# Patient Record
Sex: Female | Born: 1985 | Hispanic: Yes | Marital: Single | Smoking: Former smoker
Health system: Southern US, Community
[De-identification: ages and names within clinical notes are randomized; demographics above are authoritative.]

## PROBLEM LIST (undated history)

## (undated) ENCOUNTER — Emergency Department (HOSPITAL_COMMUNITY): Payer: Self-pay | Source: Home / Self Care

## (undated) DIAGNOSIS — F32A Depression, unspecified: Secondary | ICD-10-CM

## (undated) DIAGNOSIS — E785 Hyperlipidemia, unspecified: Secondary | ICD-10-CM

## (undated) DIAGNOSIS — I639 Cerebral infarction, unspecified: Secondary | ICD-10-CM

## (undated) DIAGNOSIS — O034 Incomplete spontaneous abortion without complication: Secondary | ICD-10-CM

## (undated) DIAGNOSIS — F329 Major depressive disorder, single episode, unspecified: Secondary | ICD-10-CM

## (undated) HISTORY — PX: DILATION AND CURETTAGE OF UTERUS: SHX78

## (undated) HISTORY — DX: Incomplete spontaneous abortion without complication: O03.4

## (undated) HISTORY — DX: Hyperlipidemia, unspecified: E78.5

---
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
## 2006-06-02 ENCOUNTER — Emergency Department: Payer: Self-pay | Admitting: Emergency Medicine

## 2006-06-19 ENCOUNTER — Emergency Department: Payer: Self-pay | Admitting: Emergency Medicine

## 2006-09-28 ENCOUNTER — Emergency Department: Payer: Self-pay | Admitting: Emergency Medicine

## 2007-02-19 ENCOUNTER — Emergency Department: Payer: Self-pay | Admitting: Emergency Medicine

## 2007-02-22 ENCOUNTER — Ambulatory Visit: Payer: Self-pay | Admitting: Obstetrics and Gynecology

## 2007-02-24 ENCOUNTER — Ambulatory Visit: Payer: Self-pay | Admitting: Obstetrics and Gynecology

## 2008-05-13 ENCOUNTER — Emergency Department: Payer: Self-pay | Admitting: Emergency Medicine

## 2010-07-04 ENCOUNTER — Emergency Department: Payer: Self-pay | Admitting: Unknown Physician Specialty

## 2012-10-01 ENCOUNTER — Emergency Department: Payer: Self-pay | Admitting: Emergency Medicine

## 2012-10-01 LAB — BASIC METABOLIC PANEL
Calcium, Total: 8.6 mg/dL (ref 8.5–10.1)
EGFR (Non-African Amer.): >60
Glucose: 105 mg/dL — ABNORMAL HIGH (ref 65–99)
Osmolality: 284 (ref 275–301)
Potassium: 3.7 mmol/L (ref 3.5–5.1)
Sodium: 141 mmol/L (ref 136–145)

## 2012-10-01 LAB — CBC
HCT: 39.6 % (ref 35.0–47.0)
HGB: 13.5 g/dL (ref 12.0–16.0)
MCH: 31.6 pg (ref 26.0–34.0)
MCHC: 34.1 g/dL (ref 32.0–36.0)
RBC: 4.27 10*6/uL (ref 3.80–5.20)
RDW: 12.7 % (ref 11.5–14.5)
WBC: 9.9 10*3/uL (ref 3.6–11.0)

## 2012-10-02 LAB — MONONUCLEOSIS SCREEN: Mono Test: NEGATIVE

## 2012-10-04 LAB — BETA STREP CULTURE(ARMC)

## 2013-05-27 ENCOUNTER — Emergency Department: Payer: Self-pay | Admitting: Emergency Medicine

## 2013-05-27 LAB — CBC
HCT: 35.9 % (ref 35.0–47.0)
HGB: 12.4 g/dL (ref 12.0–16.0)
MCH: 31.1 pg (ref 26.0–34.0)
Platelet: 235 10*3/uL (ref 150–440)
RDW: 12.3 % (ref 11.5–14.5)

## 2013-05-27 LAB — URINALYSIS, COMPLETE
Bilirubin,UR: NEGATIVE
Blood: NEGATIVE
Glucose,UR: NEGATIVE mg/dL (ref 0–75)
Ketone: NEGATIVE
Leukocyte Esterase: NEGATIVE
Nitrite: NEGATIVE
Ph: 5 (ref 4.5–8.0)
RBC,UR: 1 /HPF (ref 0–5)
Specific Gravity: 1.028 (ref 1.003–1.030)
WBC UR: 6 /HPF (ref 0–5)

## 2013-05-27 LAB — COMPREHENSIVE METABOLIC PANEL
Alkaline Phosphatase: 58 U/L
Anion Gap: 6 — ABNORMAL LOW (ref 7–16)
Bilirubin,Total: 0.2 mg/dL (ref 0.2–1.0)
Calcium, Total: 8.3 mg/dL — ABNORMAL LOW (ref 8.5–10.1)
Chloride: 111 mmol/L — ABNORMAL HIGH (ref 98–107)
Osmolality: 275 (ref 275–301)
Potassium: 3.5 mmol/L (ref 3.5–5.1)
SGPT (ALT): 17 U/L (ref 12–78)

## 2013-05-27 LAB — PREGNANCY, URINE: Pregnancy Test, Urine: POSITIVE m[IU]/mL

## 2013-05-27 LAB — MAGNESIUM: Magnesium: 1.3 mg/dL — ABNORMAL LOW

## 2013-05-28 LAB — GC/CHLAMYDIA PROBE AMP

## 2013-06-06 DIAGNOSIS — O0990 Supervision of high risk pregnancy, unspecified, unspecified trimester: Secondary | ICD-10-CM | POA: Insufficient documentation

## 2013-06-06 DIAGNOSIS — R7303 Prediabetes: Secondary | ICD-10-CM | POA: Insufficient documentation

## 2013-06-06 DIAGNOSIS — IMO0002 Reserved for concepts with insufficient information to code with codable children: Secondary | ICD-10-CM | POA: Insufficient documentation

## 2013-06-06 DIAGNOSIS — Z8759 Personal history of other complications of pregnancy, childbirth and the puerperium: Secondary | ICD-10-CM | POA: Insufficient documentation

## 2013-06-25 DIAGNOSIS — F319 Bipolar disorder, unspecified: Secondary | ICD-10-CM | POA: Insufficient documentation

## 2013-09-27 ENCOUNTER — Emergency Department: Payer: Self-pay | Admitting: Emergency Medicine

## 2013-10-04 DIAGNOSIS — M778 Other enthesopathies, not elsewhere classified: Secondary | ICD-10-CM | POA: Insufficient documentation

## 2013-11-13 ENCOUNTER — Observation Stay: Payer: Self-pay | Admitting: Obstetrics and Gynecology

## 2013-11-13 LAB — CREATININE, SERUM
CREATININE: 0.6 mg/dL (ref 0.60–1.30)
EGFR (African American): >60
EGFR (Non-African Amer.): >60

## 2013-11-13 LAB — PROTEIN / CREATININE RATIO, URINE
Creatinine, Urine: 60.2 mg/dL (ref 30.0–125.0)
PROTEIN, RANDOM URINE: 85 mg/dL — AB (ref 0–12)
Protein/Creat. Ratio: 1412 mg/gCREAT — ABNORMAL HIGH (ref 0–200)

## 2013-11-13 LAB — SGOT (AST)(ARMC): SGOT(AST): 17 U/L (ref 15–37)

## 2013-11-13 LAB — HEMOGLOBIN: HGB: 10.1 g/dL — ABNORMAL LOW (ref 12.0–16.0)

## 2013-11-13 LAB — PLATELET COUNT: Platelet: 191 10*3/uL (ref 150–440)

## 2013-11-13 LAB — ALT: SGPT (ALT): 11 U/L — ABNORMAL LOW (ref 12–78)

## 2013-11-13 LAB — HEMATOCRIT: HCT: 30.8 % — ABNORMAL LOW (ref 35.0–47.0)

## 2013-12-03 DIAGNOSIS — B001 Herpesviral vesicular dermatitis: Secondary | ICD-10-CM | POA: Insufficient documentation

## 2014-05-22 ENCOUNTER — Emergency Department: Payer: Self-pay | Admitting: Emergency Medicine

## 2014-05-22 LAB — BASIC METABOLIC PANEL
Anion Gap: 9 (ref 7–16)
BUN: 10 mg/dL (ref 7–18)
CO2: 29 mmol/L (ref 21–32)
Calcium, Total: 10.6 mg/dL — ABNORMAL HIGH (ref 8.5–10.1)
Chloride: 101 mmol/L (ref 98–107)
Creatinine: 0.55 mg/dL — ABNORMAL LOW (ref 0.60–1.30)
EGFR (African American): >60
Glucose: 110 mg/dL — ABNORMAL HIGH (ref 65–99)
OSMOLALITY: 277 (ref 275–301)
Potassium: 3.8 mmol/L (ref 3.5–5.1)
Sodium: 139 mmol/L (ref 136–145)

## 2014-05-22 LAB — CBC
HCT: 42.7 % (ref 35.0–47.0)
HGB: 14.4 g/dL (ref 12.0–16.0)
MCH: 30.9 pg (ref 26.0–34.0)
MCHC: 33.7 g/dL (ref 32.0–36.0)
MCV: 92 fL (ref 80–100)
PLATELETS: 224 10*3/uL (ref 150–440)
RBC: 4.65 10*6/uL (ref 3.80–5.20)
RDW: 13 % (ref 11.5–14.5)
WBC: 10.9 10*3/uL (ref 3.6–11.0)

## 2014-05-22 LAB — TROPONIN I

## 2014-10-29 NOTE — H&P (Signed)
L&D Evaluation:  History Expanded:  HPI 29 yo G3P2002 at 8455w4d gestational age by unknown criteria (no records available).  The patient states that [facility].  [facility] has no records of her pregnancy at this time.  She states that she is high [facility] due to a history of high cholesterol, preeclampsia with her pregnancies, and history of 2 prior cesarean sections. She otherwise has had no other complications with this pregnancy. She presents with decreased fetal movement, no leakage of fluid, no vaginal bleeding. no contractions.   She denies headaches, visual disturbances, and RUQ pain.   Group B Strep Results Maternal (Result >5wks must be treated as unknown) unknown/result > 5 weeks ago   Patient's Medical History 1) high cholesterol, 2) obesity   Patient's Surgical History 1) prior c-sections x 2, 2) D&C   Medications Pre Natal Vitamins  omeprazole   Allergies NKDA   Social History none   ROS:  ROS All systems were reviewed.  HEENT, CNS, GI, GU, Respiratory, CV, Renal and Musculoskeletal systems were found to be normal., unless otherwise noted in HPI   Exam:  Vital Signs T98.5, BP 128/72, 140/77, 109/54, 102/54, 105/57, 102/56, 136/94, 138/80, P 80s-90s,   General no apparent distress   Mental Status clear   Chest clear   Heart normal sinus rhythm   Abdomen gravid, non-tender   Back no CVAT   Edema no edema   Reflexes 2+   FHT normal rate with no decels   FHT Description 135/mod var/+accels/no decels   Ucx absent   Impression:  Impression reactive NST, 1) Intrauterine pregnancy at 5855w4d   Plan:  Comments 1) decreased fetal movement: reassured patient given gestational age with reactive NST. 2) elevated BPs: will get preeclampsia panel.  UPC ratio ~1,400. Highly recommend to patient that she get close follow up at this point to monitor BPs.  Will need at least weekly NSTs.   3) encouraged follow up ASAP given BPs and elevated UPC  ratio and history of preeclampsia.   Follow Up Appointment need to schedule. ASAP   Labs:  Lab Results:  Misc Urine Chem:  26-May-15 21:12   Protein/Creat Ratio (comp)  1412 (Result(s) reported on 13 Nov 2013 at 10:09PM.)   Electronic Signatures: Conard NovakJackson, Brynn Mulgrew D (MD)  (Signed 26-May-15 22:36)  Authored: L&D Evaluation, Labs   Last Updated: 26-May-15 22:36 by Conard NovakJackson, Saniyya Gau D (MD)

## 2015-04-23 ENCOUNTER — Emergency Department
Admission: EM | Admit: 2015-04-23 | Discharge: 2015-04-23 | Disposition: A | Discharge status: Home / Self Care | Payer: Self-pay | Attending: Emergency Medicine | Admitting: Emergency Medicine

## 2015-04-23 ENCOUNTER — Emergency Department: Payer: Self-pay

## 2015-04-23 ENCOUNTER — Encounter: Payer: Self-pay | Admitting: Emergency Medicine

## 2015-04-23 DIAGNOSIS — Z87891 Personal history of nicotine dependence: Secondary | ICD-10-CM | POA: Insufficient documentation

## 2015-04-23 DIAGNOSIS — Z3A01 Less than 8 weeks gestation of pregnancy: Secondary | ICD-10-CM | POA: Insufficient documentation

## 2015-04-23 DIAGNOSIS — O034 Incomplete spontaneous abortion without complication: Secondary | ICD-10-CM | POA: Insufficient documentation

## 2015-04-23 DIAGNOSIS — I10 Essential (primary) hypertension: Secondary | ICD-10-CM | POA: Insufficient documentation

## 2015-04-23 DIAGNOSIS — R109 Unspecified abdominal pain: Secondary | ICD-10-CM

## 2015-04-23 LAB — URINALYSIS COMPLETE WITH MICROSCOPIC (ARMC ONLY)
BILIRUBIN URINE: NEGATIVE
Bacteria, UA: NONE SEEN
Glucose, UA: NEGATIVE mg/dL
KETONES UR: NEGATIVE mg/dL
Nitrite: NEGATIVE
PH: 6 (ref 5.0–8.0)
Protein, ur: NEGATIVE mg/dL
Specific Gravity, Urine: 1.018 (ref 1.005–1.030)

## 2015-04-23 LAB — CBC
HCT: 35.4 % (ref 35.0–47.0)
Hemoglobin: 12 g/dL (ref 12.0–16.0)
MCH: 29.2 pg (ref 26.0–34.0)
MCHC: 33.8 g/dL (ref 32.0–36.0)
MCV: 86.4 fL (ref 80.0–100.0)
Platelets: 211 10*3/uL (ref 150–440)
RBC: 4.1 MIL/uL (ref 3.80–5.20)
RDW: 15.4 % — ABNORMAL HIGH (ref 11.5–14.5)
WBC: 10.1 10*3/uL (ref 3.6–11.0)

## 2015-04-23 LAB — ABO/RH: ABO/RH(D): O POS

## 2015-04-23 LAB — HCG, QUANTITATIVE, PREGNANCY: hCG, Beta Chain, Quant, S: 5120 m[IU]/mL — ABNORMAL HIGH (ref <5)

## 2015-04-23 MED ORDER — OXYCODONE-ACETAMINOPHEN 5-325 MG PO TABS
ORAL_TABLET | ORAL | Status: AC
  Administered 2015-04-23: 1 via ORAL
  Filled 2015-04-23: qty 1

## 2015-04-23 MED ORDER — OXYCODONE-ACETAMINOPHEN 5-325 MG PO TABS
1.0000 | ORAL_TABLET | Freq: Once | ORAL | Status: AC
  Administered 2015-04-23: 1 via ORAL

## 2015-04-23 MED ORDER — OXYCODONE-ACETAMINOPHEN 5-325 MG PO TABS: 1.0000 | ORAL_TABLET | Freq: Four times a day (QID) | ORAL | Status: DC | PRN

## 2015-04-23 MED ORDER — OXYCODONE-ACETAMINOPHEN 5-325 MG PO TABS
1.0000 | ORAL_TABLET | Freq: Once | ORAL | Status: AC
  Administered 2015-04-23: 1 via ORAL
  Filled 2015-04-23: qty 1

## 2015-04-23 NOTE — Discharge Instructions (Signed)
Incomplete Miscarriage A miscarriage is the sudden loss of an unborn baby (fetus) before the 20th week of pregnancy. In an incomplete miscarriage, parts of the fetus or placenta (afterbirth) remain in the body.  Having a miscarriage can be an emotional experience. Talk with your health care provider about any questions you may have about miscarrying, the grieving process, and your future pregnancy plans. CAUSES   Problems with the fetal chromosomes that make it impossible for the baby to develop normally. Problems with the baby's genes or chromosomes are most often the result of errors that occur by chance as the embryo divides and grows. The problems are not inherited from the parents.  Infection of the cervix or uterus.  Hormone problems.  Problems with the cervix, such as having an incompetent cervix. This is when the tissue in the cervix is not strong enough to hold the pregnancy.  Problems with the uterus, such as an abnormally shaped uterus, uterine fibroids, or congenital abnormalities.  Certain medical conditions.  Smoking, drinking alcohol, or taking illegal drugs.  Trauma. SYMPTOMS   Vaginal bleeding or spotting, with or without cramps or pain.  Pain or cramping in the abdomen or lower back.  Passing fluid, tissue, or blood clots from the vagina. DIAGNOSIS  Your health care provider will perform a physical exam. You may also have an ultrasound to confirm the miscarriage. Blood or urine tests may also be ordered. TREATMENT   Usually, a dilation and curettage (D&C) procedure is performed. During a D&C procedure, the cervix is widened (dilated) and any remaining fetal or placental tissue is gently removed from the uterus.  Antibiotic medicines are prescribed if there is an infection. Other medicines may be given to reduce the size of the uterus (contract) if there is a lot of bleeding.  If you have Rh negative blood and your baby was Rh positive, you will need a Rho (D)  immune globulin shot. This shot will protect any future baby from having Rh blood problems in future pregnancies.  You may be confined to bed rest. This means you should stay in bed and only get up to use the bathroom. HOME CARE INSTRUCTIONS   Rest as directed by your health care provider.  Restrict activity as directed by your health care provider. You may be allowed to continue light activity if curettage was not done but you require further treatment.  Keep track of the number of pads you use each day. Keep track of how soaked (saturated) they are. Record this information.  Do not  use tampons.  Do not douche or have sexual intercourse until approved by your health care provider.  Keep all follow-up appointments for reevaluation and continuing management.  Only take over-the-counter or prescription medicines for pain, fever, or discomfort as directed by your health care provider.  Take antibiotic medicine as directed by your health care provider. Make sure you finish it even if you start to feel better. SEEK IMMEDIATE MEDICAL CARE IF:   You experience severe cramps in your stomach, back, or abdomen.  You have an unexplained temperature (make sure to record these temperatures).  You pass large clots or tissue (save these for your health care provider to inspect).  Your bleeding increases.  You become light-headed, weak, or have fainting episodes. MAKE SURE YOU:   Understand these instructions.  Will watch your condition.  Will get help right away if you are not doing well or get worse.   This information is not intended to   replace advice given to you by your health care provider. Make sure you discuss any questions you have with your health care provider.   Document Released: 06/07/2005 Document Revised: 06/28/2014 Document Reviewed: 01/04/2013 Elsevier Interactive Patient Education 2016 [employer] Inc[employer]  

## 2015-04-23 NOTE — ED Notes (Signed)
Patient still in US.

## 2015-04-23 NOTE — ED Provider Notes (Addendum)
[facility] Emergency Department Provider Note  REMINDER - THIS NOTE IS NOT A FINAL MEDICAL RECORD UNTIL IT IS SIGNED. UNTIL THEN, THE CONTENT BELOW MAY REFLECT INFORMATION FROM A DOCUMENTATION TEMPLATE, NOT THE ACTUAL PATIENT VISIT. ____________________________________________  Time seen: Approximately 11:19 PM  I have reviewed the triage vital signs and the nursing notes.   HISTORY  Chief Complaint Abdominal Cramping    HPI Alice Austin is a 29 y.o. female history of hypertension, hypercholesterolemia, and 3 previous C-sections and a D&C.  Patient reports that she's been pregnant, last menstrual period was about middle of August. She [facility]. On Tuesday she started to develop some abdominal cramps and bleeding which is consistent with a period, the bleeding has since stopped but she continues to have occasional and moderately painful cramps in the lower abdomen. She called the clinic today, they advised her to come back for evaluation but [facility] so therefore came to our emergency room.  She denies any nausea, vomiting, fever, lightheadedness. She did take 1 Percocet pill at approximately 6 PM which she had left over from previous cesarean section, but has now run out.  History reviewed. No pertinent past medical history.  There are no active problems to display for this patient.   Past Surgical History  Procedure Laterality Date  . Cesarean section    . Dilation and curettage of uterus      No current outpatient prescriptions on file.  Allergies Review of patient's allergies indicates no known allergies.  No family history on file.  Social History Social History  Substance Use Topics  . Smoking status: Former Games developermoker  . Smokeless tobacco: None  . Alcohol Use: No    Review of Systems Constitutional: No fever/chills Eyes: No visual changes. ENT: No  sore throat. Cardiovascular: Denies chest pain. Respiratory: Denies shortness of breath. Gastrointestinal: N No nausea, no vomiting.  No diarrhea.  No constipation. Genitourinary: Negative for dysuria. Musculoskeletal: Negative for back pain. Skin: Negative for rash. Neurological: Negative for headaches, focal weakness or numbness.  10-point ROS otherwise negative.  ____________________________________________   PHYSICAL EXAM:  VITAL SIGNS: ED Triage Vitals  Enc Vitals Group     BP 04/23/15 2007 135/95 mmHg     Pulse Rate 04/23/15 2007 93     Resp 04/23/15 2007 18     Temp 04/23/15 2007 98.2 F (36.8 C)     Temp Source 04/23/15 2007 Oral     SpO2 04/23/15 2007 97 %     Weight 04/23/15 2007 211 lb (95.709 kg)     Height 04/23/15 2007 5' (1.524 m)     Head Cir --      Peak Flow --      Pain Score 04/23/15 2006 9     Pain Loc --      Pain Edu? --      Excl. in GC? --    Constitutional: Alert and oriented. Well appearing and in no acute distress. Eyes: Conjunctivae are normal. PERRL. EOMI. Head: Atraumatic. Nose: No congestion/rhinnorhea. Mouth/Throat: Mucous membranes are moist.  Oropharynx non-erythematous. Neck: No stridor.   Cardiovascular: Normal rate, regular rhythm. Grossly normal heart sounds.  Good peripheral circulation. Respiratory: Normal respiratory effort.  No retractions. Lungs CTAB. Gastrointestinal: Soft and nontender to for some mild to moderate tenderness suprapubically without rebound or guarding. No distention. No abdominal bruits. No CVA tenderness. Musculoskeletal: No lower extremity  tenderness nor edema.  No joint effusions. Neurologic:  Normal speech and language. No gross focal neurologic deficits are appreciated. No gait instability. Skin:  Skin is warm, dry and intact. No rash noted. Psychiatric: Mood and affect are normal. Speech and behavior are normal.  ____________________________________________   LABS (all labs ordered are listed, but  only abnormal results are displayed)  Labs Reviewed  CBC - Abnormal; Notable for the following:    RDW 15.4 (*)    All other components within normal limits  HCG, QUANTITATIVE, PREGNANCY - Abnormal; Notable for the following:    hCG, Beta Chain, Quant, S 5120 (*)    All other components within normal limits  URINALYSIS COMPLETEWITH MICROSCOPIC ([facility] ONLY) - Abnormal; Notable for the following:    Color, Urine YELLOW (*)    APPearance CLOUDY (*)    Hgb urine dipstick 3+ (*)    Leukocytes, UA TRACE (*)    Squamous Epithelial / LPF 6-30 (*)    All other components within normal limits  ABO/RH   ____________________________________________  EKG   ____________________________________________  RADIOLOGY  US OB Comp Less 14 Wks (Final result) Result time: 04/23/15 22:33:39   Final result by Rad Results In Interface (04/23/15 22:33:39)   Narrative:   CLINICAL DATA: Lower abdominal pain of 8 hours duration. Vaginal bleeding.  EXAM: OBSTETRIC <14 WK [place] AND TRANSVAGINAL OB US  TECHNIQUE: Both transabdominal and transvaginal ultrasound examinations were performed for complete evaluation of the gestation as well as the maternal uterus, adnexal regions, and pelvic cul-de-sac. Transvaginal technique was performed to assess early pregnancy.  COMPARISON: None.  FINDINGS: Intrauterine gestational sac: There is a single gestational sac, located in the upper endocervical canal.  Yolk sac: No  Embryo: Yes  Cardiac Activity: None  Heart Rate: 0 bpm  MSD:  mm  w   d  CRL: 4.4 mm  6 w  1 d         [place] EDC:  Maternal uterus/adnexae: Unremarkable ovaries. 4.5 cm anterior uterine fibroid.  IMPRESSION: Incomplete abortion. Gestational sac with nonviable fetal pole is visible in the upper endocervical canal. Incidentally noted 4.5 cm anterior uterine fibroid.    ____________________________________________   PROCEDURES  Procedure(s) performed:  None  Critical Care performed: No  ____________________________________________   INITIAL IMPRESSION / ASSESSMENT AND PLAN / ED COURSE  Pertinent labs & imaging results that were available during my care of the patient were reviewed by me and considered in my medical decision making (see chart for details).  She presents with abdominal pain after chemical abortion. She is in no distress, hemodynamically stable. Ultrasound demonstrates incomplete abortion. Called and discussed with on-call gynecologist.  Patient shows no evidence of infection, hemodynamic compromise, or significant hemorrhage. Her Rh status is positive. ____________________________________________   FINAL CLINICAL IMPRESSION(S) / ED DIAGNOSES  Final diagnoses:  Abdominal cramping        Discussed with Dr. Ardine Bjork: Advises call tomorrow for follow-up in next 48 hours as outpatient. Discussed with the patient, she is hemodynamically stable, she is in no distress, and is agreeable with the plan to follow-up with obstetrics and gynecology within the next 1-2 days at the clinic.  I discussed with the patient the importance of follow-up, she is very agreeable, we also discussed return precautions including any severe pain, fever, heavy bleeding, lightheadedness, vomiting or other new concerns arise.  Patient's mother's driving her home.  Sharyn Creamer, MD 04/23/15 2323  I will prescribe the patient a narcotic pain medicine due  to their condition which I anticipate will cause at least moderate pain short term. I discussed with the patient safe use of narcotic pain medicines, and that they are not to drive, work in dangerous areas, or ever take more than prescribed (no more than 1 pill every 6 hours). We discussed that this is the type of medication that "Criss Alvine" may have overdosed on and the risks of this type of medicine. Patient is very agreeable to only use as prescribed and to never use more than  prescribed.   Sharyn Creamer, MD 04/23/15 2329

## 2015-04-23 NOTE — ED Notes (Signed)
Patient actually in ultrasound.  Has not arrived to room yet.

## 2015-04-23 NOTE — ED Notes (Signed)
Patient ambulatory to triage, appears uncomfortable with slow gait and grimacing; pt reports (approx 4-[redacted]wks pregnant,  G5 P3)abortion pill 10/31 1230p in office; took additional at 6pm 11/1; had subsequent bleeding; st now >8hrs and having severe lower abd pain radiating thru to back with no bleeding; took percocet from old rx for csection (last ds 4pm 1 tab)

## 2015-04-24 ENCOUNTER — Encounter: Payer: Self-pay | Admitting: Obstetrics and Gynecology

## 2015-04-24 ENCOUNTER — Ambulatory Visit (INDEPENDENT_AMBULATORY_CARE_PROVIDER_SITE_OTHER): Payer: Self-pay | Admitting: Obstetrics and Gynecology

## 2015-04-24 VITALS — BP 132/85 | HR 86 | Ht 60.0 in | Wt 205.4 lb

## 2015-04-24 DIAGNOSIS — O034 Incomplete spontaneous abortion without complication: Secondary | ICD-10-CM

## 2015-04-24 MED ORDER — OXYCODONE-ACETAMINOPHEN 5-325 MG PO TABS: 1.0000 | ORAL_TABLET | Freq: Four times a day (QID) | ORAL | Status: DC | PRN

## 2015-04-24 NOTE — Patient Instructions (Signed)
1.  Surgery is scheduled for tomorrow morning. 2.  Do not eat anything after midnight or drink anything after midnight. 3.  Report to pre-admit testing at 7 AM  In the hospital lobby. 4.  Return in 1 week for a postop check following your D&C

## 2015-04-24 NOTE — ED Notes (Signed)

## 2015-04-25 ENCOUNTER — Encounter
Admission: RE | Disposition: A | Discharge status: Home / Self Care | Payer: Self-pay | Source: Ambulatory Visit | Attending: Obstetrics and Gynecology

## 2015-04-25 ENCOUNTER — Ambulatory Visit
Admission: RE | Admit: 2015-04-25 | Discharge: 2015-04-25 | Disposition: A | Discharge status: Home / Self Care | Payer: Self-pay | Source: Ambulatory Visit | Attending: Obstetrics and Gynecology | Admitting: Obstetrics and Gynecology

## 2015-04-25 ENCOUNTER — Encounter: Payer: Self-pay | Admitting: *Deleted

## 2015-04-25 ENCOUNTER — Ambulatory Visit: Payer: Self-pay | Admitting: Anesthesiology

## 2015-04-25 DIAGNOSIS — Z3A01 Less than 8 weeks gestation of pregnancy: Secondary | ICD-10-CM | POA: Insufficient documentation

## 2015-04-25 DIAGNOSIS — Z833 Family history of diabetes mellitus: Secondary | ICD-10-CM | POA: Insufficient documentation

## 2015-04-25 DIAGNOSIS — O034 Incomplete spontaneous abortion without complication: Secondary | ICD-10-CM | POA: Insufficient documentation

## 2015-04-25 DIAGNOSIS — Z9889 Other specified postprocedural states: Secondary | ICD-10-CM | POA: Insufficient documentation

## 2015-04-25 DIAGNOSIS — Z87891 Personal history of nicotine dependence: Secondary | ICD-10-CM | POA: Insufficient documentation

## 2015-04-25 DIAGNOSIS — E785 Hyperlipidemia, unspecified: Secondary | ICD-10-CM | POA: Insufficient documentation

## 2015-04-25 HISTORY — PX: DILATION AND EVACUATION: SHX1459

## 2015-04-25 LAB — CBC WITH DIFFERENTIAL/PLATELET
Basophils Absolute: 0.1 10*3/uL (ref 0–0.1)
Basophils Relative: 1 %
EOS PCT: 2 %
Eosinophils Absolute: 0.1 10*3/uL (ref 0–0.7)
HCT: 36.7 % (ref 35.0–47.0)
Hemoglobin: 12.1 g/dL (ref 12.0–16.0)
LYMPHS ABS: 2 10*3/uL (ref 1.0–3.6)
LYMPHS PCT: 30 %
MCH: 28.6 pg (ref 26.0–34.0)
MCHC: 33.1 g/dL (ref 32.0–36.0)
MCV: 86.3 fL (ref 80.0–100.0)
MONO ABS: 0.6 10*3/uL (ref 0.2–0.9)
MONOS PCT: 9 %
Neutro Abs: 4.1 10*3/uL (ref 1.4–6.5)
Neutrophils Relative %: 58 %
PLATELETS: 217 10*3/uL (ref 150–440)
RBC: 4.25 MIL/uL (ref 3.80–5.20)
RDW: 15.4 % — AB (ref 11.5–14.5)
WBC: 6.9 10*3/uL (ref 3.6–11.0)

## 2015-04-25 LAB — RAPID HIV SCREEN (HIV 1/2 AB+AG)
HIV 1/2 Antibodies: NONREACTIVE
HIV-1 P24 Antigen - HIV24: NONREACTIVE

## 2015-04-25 LAB — TYPE AND SCREEN
ABO/RH(D): O POS
Antibody Screen: NEGATIVE

## 2015-04-25 SURGERY — DILATION AND EVACUATION, UTERUS
Anesthesia: General

## 2015-04-25 MED ORDER — IBUPROFEN 800 MG PO TABS: 800.0000 mg | ORAL_TABLET | Freq: Three times a day (TID) | ORAL | Status: DC

## 2015-04-25 MED ORDER — FENTANYL CITRATE (PF) 100 MCG/2ML IJ SOLN
INTRAMUSCULAR | Status: AC
  Administered 2015-04-25: 25 ug via INTRAVENOUS
  Filled 2015-04-25: qty 2

## 2015-04-25 MED ORDER — FENTANYL CITRATE (PF) 100 MCG/2ML IJ SOLN
INTRAMUSCULAR | Status: DC | PRN
  Administered 2015-04-25: 50 ug via INTRAVENOUS

## 2015-04-25 MED ORDER — PROPOFOL 10 MG/ML IV BOLUS
INTRAVENOUS | Status: DC | PRN
  Administered 2015-04-25: 200 mg via INTRAVENOUS

## 2015-04-25 MED ORDER — DEXAMETHASONE SODIUM PHOSPHATE 4 MG/ML IJ SOLN
INTRAMUSCULAR | Status: DC | PRN
Start: 2015-04-25 — End: 2015-04-25
  Administered 2015-04-25: 5 mg via INTRAVENOUS

## 2015-04-25 MED ORDER — FENTANYL CITRATE (PF) 100 MCG/2ML IJ SOLN
25.0000 ug | INTRAMUSCULAR | Status: DC | PRN
  Administered 2015-04-25 (×5): 25 ug via INTRAVENOUS

## 2015-04-25 MED ORDER — LIDOCAINE HCL (CARDIAC) 20 MG/ML IV SOLN
INTRAVENOUS | Status: DC | PRN
  Administered 2015-04-25: 100 mg via INTRAVENOUS

## 2015-04-25 MED ORDER — MIDAZOLAM HCL 2 MG/2ML IJ SOLN
INTRAMUSCULAR | Status: DC | PRN
  Administered 2015-04-25: 2 mg via INTRAVENOUS

## 2015-04-25 MED ORDER — ONDANSETRON HCL 4 MG/2ML IJ SOLN: 4.0000 mg | Freq: Once | INTRAMUSCULAR | Status: DC | PRN

## 2015-04-25 MED ORDER — ONDANSETRON HCL 4 MG/2ML IJ SOLN
INTRAMUSCULAR | Status: DC | PRN
  Administered 2015-04-25: 4 mg via INTRAVENOUS

## 2015-04-25 MED ORDER — SUCCINYLCHOLINE CHLORIDE 20 MG/ML IJ SOLN
INTRAMUSCULAR | Status: DC | PRN
  Administered 2015-04-25: 100 mg via INTRAVENOUS

## 2015-04-25 MED ORDER — LACTATED RINGERS IV SOLN
INTRAVENOUS | Status: DC
  Administered 2015-04-25: 08:00:00 via INTRAVENOUS

## 2015-04-25 SURGICAL SUPPLY — 19 items
CATH ROBINSON RED A/P 16FR (CATHETERS) ×3 IMPLANT
DRSG TELFA 3X8 NADH (GAUZE/BANDAGES/DRESSINGS) ×3 IMPLANT
FILTER UTR ASPR SPEC (MISCELLANEOUS) ×1 IMPLANT
FLTR UTR ASPR SPEC (MISCELLANEOUS) ×3
GLOVE BIO SURGEON STRL SZ8 (GLOVE) ×15 IMPLANT
GOWN STRL REUS W/ TWL LRG LVL3 (GOWN DISPOSABLE) ×1 IMPLANT
GOWN STRL REUS W/TWL LRG LVL3 (GOWN DISPOSABLE) ×2
KIT BERKELEY 1ST TRIMESTER 3/8 (MISCELLANEOUS) ×3 IMPLANT
KIT RM TURNOVER CYSTO AR (KITS) ×3 IMPLANT
PACK DNC HYST (MISCELLANEOUS) ×3 IMPLANT
PAD OB MATERNITY 4.3X12.25 (PERSONAL CARE ITEMS) ×3 IMPLANT
PAD PREP 24X41 OB/GYN DISP (PERSONAL CARE ITEMS) ×3 IMPLANT
SET BERKELEY SUCTION TUBING (SUCTIONS) ×3 IMPLANT
SOL PREP PVP 2OZ (MISCELLANEOUS) ×3
SOLUTION PREP PVP 2OZ (MISCELLANEOUS) ×1 IMPLANT
SPONGE XRAY 4X4 16PLY STRL (MISCELLANEOUS) ×3 IMPLANT
TOWEL OR 17X26 4PK STRL BLUE (TOWEL DISPOSABLE) ×3 IMPLANT
VACURETTE 10 RIGID CVD (CANNULA) IMPLANT
VACURETTE 8 RIGID CVD (CANNULA) ×3 IMPLANT

## 2015-04-25 NOTE — Anesthesia Procedure Notes (Signed)
Procedure Name: Intubation Date/Time: 04/25/2015 9:27 AM Performed by: Almeta MonasFLETCHER, Jeremey Bascom Pre-anesthesia Checklist: Patient identified, Emergency Drugs available, Suction available and Patient being monitored Patient Re-evaluated:Patient Re-evaluated prior to inductionOxygen Delivery Method: Circle system utilized Preoxygenation: Pre-oxygenation with 100% oxygen Intubation Type: IV induction Laryngoscope Size: Mac and 3 Grade View: Grade I Tube type: Oral Tube size: 7.0 mm Number of attempts: 1 Airway Equipment and Method: Stylet

## 2015-04-25 NOTE — Discharge Instructions (Signed)

## 2015-04-25 NOTE — H&P (Signed)
Subjective:    Patient is a 29 y.o. G4P2012female scheduled for Suction D&C. Indications for procedure are Incomplete Abortion.   Pertinent Gynecological History: SAB x 1 requiring D&C Current Incomplete AB with U/S demonstrating 6.1 week IUP with no FCA.    Menstrual History: OB History    Gravida Para Term Preterm AB TAB SAB Ectopic Multiple Living   4 1 1  2 1 1   2      Menarche age: na  Patient's last menstrual period was 03/03/2015.    Past Medical History  Diagnosis Date  . Incomplete abortion   . Hyperlipemia     Past Surgical History  Procedure Laterality Date  . Cesarean section      2015  . Dilation and curettage of uterus      2009    OB History  Gravida Para Term Preterm AB SAB TAB Ectopic Multiple Living  4 1 1  2 1 1   2    # Outcome Date GA Lbr Len/2nd Weight Sex Delivery Anes PTL Lv  4 TAB 2015          3 SAB 2009          2 Term 2007   7 lb (3.175 kg) F CS-LTranv   Y  1 Gravida 2004   6 lb 9.6 oz (2.994 kg) M CS-LTranv   Y      Social History   Social History  . Marital Status: Single    Spouse Name: N/A  . Number of Children: N/A  . Years of Education: N/A   Social History Main Topics  . Smoking status: Former Smoker  . Smokeless tobacco: None  . Alcohol Use: No  . Drug Use: No  . Sexual Activity: Yes    Birth Control/ Protection: None   Other Topics Concern  . None   Social History Narrative    Family History  Problem Relation Age of Onset  . Diabetes Father   . Cancer Neg Hx   . Heart disease Neg Hx      (Not in a hospital admission)  No Known Allergies  Review of Systems Constitutional: No recent fever/chills/sweats Respiratory: No recent cough/bronchitis Cardiovascular: No chest pain Gastrointestinal: No recent nausea/vomiting/diarrhea Genitourinary: No UTI symptoms Hematologic/lymphatic:No history of coagulopathy or recent blood thinner use    Objective:    BP 132/85 mmHg  Pulse 86  Ht 5' (1.524 m)   Wt 205 lb 6.4 oz (93.169 kg)  BMI 40.11 kg/m2  LMP 03/03/2015  General:   Normal  Skin:   normal  HEENT:  Normal  Neck:  Supple without Adenopathy or Thyromegaly  Lungs:   Heart:              Breasts:   Abdomen:  Pelvis:  M/S   Extremeties:  Neuro:    clear to auscultation bilaterally   Normal without murmur   Not Examined   soft, non-tender; bowel sounds normal; no masses,  no organomegaly   Exam deferred to OR  No CVAT  Warm/Dry   Normal          Assessment:      Incomplete AB  Plan:  Suction D&C   Counseling: Procedure, risks, reasons, benefits and complications (including injury to bowel, bladder, major blood vessel, ureter, bleeding, possibility of transfusion, infection, or fistula formation) reviewed in detail. Consent signed. Preop testing ordered. Instructions reviewed, including NPO after midnight.  

## 2015-04-25 NOTE — Interval H&P Note (Signed)
History and Physical Interval Note:  04/25/2015 8:52 AM  Frann RiderJessica M Kreher  has presented today for surgery, with the diagnosis of missed ab  The various methods of treatment have been discussed with the patient and family. After consideration of risks, benefits and other options for treatment, the patient has consented to  Procedure(s): DILATATION AND EVACUATION (N/A) as a surgical intervention .  The patient's history has been reviewed, patient examined, no change in status, stable for surgery.  I have reviewed the patient's chart and labs.  Questions were answered to the patient's satisfaction.     Daphine DeutscherMartin A Defrancesco

## 2015-04-25 NOTE — Op Note (Signed)
Alice RiderJessica M Sylla PROCEDURE DATE: 04/25/2015 9:16 AM  PREOPERATIVE DIAGNOSIS: Incomplete AB POSTOPERATIVE DIAGNOSIS: Incomplete AB PROCEDURE: Procedure(s): DILATATION AND EVACUATION (N/A) SURGEON:  Dr. Daphine DeutscherMartin A Kelsen Celona ASSISTANT: PA-S Doreene NestElena Klaus ANESTHESIA: General  INDICATIONS: 29 y.o. [id number] with history of Incomplete AB at [redacted] weeks gestation presents for surgical management.   Please see preoperative notes for further details.   FINDINGS:  Uterus wasAnterior and 8 week size. Tissue consistent with products of conception was removed and sent to Pathology.   I/O's:   SPECIMENS: POC  COMPLICATIONS: None immediate COUNTS:  YES  PROCEDURE IN DETAIL: The patient was brought to the operating room where she was placed into the supine position.  General LMA anesthesia was induced without incident. She was placed in the dorsal lithotomy position using candy cane stirrups, and was examined; A Betadine prep and drape was performed in sterile fashion.   A  Red Robinson catheter was used to drain the bladder of urine. A weighted speculum was placed into  the vagina and a single tooth tenaculum was applied to the anterior lip of the cervix. The cervix was gently dilated using Hank's Dilators to accommodate a 10 mm suction curette, that was gently advanced to the uterine fundus.  The suction device was then activated and the curette was slowly rotated to clear the uterine cavity of products of conception.  A sharp curettage with a serrated curette  was then performed to confirm complete emptying of the uterus. Minimal bleeding was encountered. The tenaculum was removed along with all instruments  from the  vagina.   The patient was awakened, mobilized and taken to the recovery room in satisfactory condition.  The procedure was well-tolerated.  The patient will be discharged to home as per PACU criteria.  Routine postoperative instructions were given along with a prescription for analgesics.  She will  follow up in the clinic in 1-2 weeks for postoperative evaluation.  Herold HarmsMartin A Davelyn Gwinn, MD [facility]

## 2015-04-25 NOTE — Anesthesia Postprocedure Evaluation (Signed)
  Anesthesia Post-op Note  Patient: Alice Austin  Procedure(s) Performed: Procedure(s): DILATATION AND EVACUATION (N/A)  Anesthesia type:General  Patient location: PACU  Post pain: Pain level controlled  Post assessment: Post-op Vital signs reviewed, Patient's Cardiovascular Status Stable, Respiratory Function Stable, Patent Airway and No signs of Nausea or vomiting  Post vital signs: Reviewed and stable  Last Vitals:  Filed Vitals:   04/25/15 1151  BP: 125/81  Pulse: 82  Temp:   Resp: 16    Level of consciousness: awake, alert  and patient cooperative  Complications: No apparent anesthesia complications

## 2015-04-25 NOTE — Progress Notes (Signed)
Chief Complaint 1. ER F/U 2. Incomplete AB  Referral from ER for management of Incomplete Abortion.  Pt [place] abortifacient (?) [place] ago; unable to return. Now with severe cramping and spotting without passage of tissue. ER U/S  Demonstrates 6.1 week IUP without FCA in LUS of uterus. Pt desires surgical intervention.  Past Medical History  Diagnosis Date  . Incomplete abortion   . Hyperlipemia    Past Surgical History  Procedure Laterality Date  . Cesarean section      2015  . Dilation and curettage of uterus      2009   PMH, PSH, Problem list, Allergies, and meds reviewed.  ROS per HPI  OBJECTIVE: BP 132/85 mmHg  Pulse 86  Ht 5' (1.524 m)  Wt 205 lb 6.4 oz (93.169 kg)  BMI 40.11 kg/m2  LMP 03/03/2015 [facility] Female in mild discomfort. HEENT: Kay/AT Neck: supple,no adenopathy or thyromegaly Lungs: clear Cor: rrr Abd: soft, nontender Pelvic: Ext Gen-normal BUS normal Vagina- minimal blood Cx-closed to RF Ut-AV, 8 week size, tender Adnexa -no masses  Assessment: Incomplete AB Desires D&C  Plan: Suction D&C Full counseling and Admission H&P completed.  Herold HarmsMartin A Verlisa Vara, MD

## 2015-04-25 NOTE — Transfer of Care (Signed)
Immediate Anesthesia Transfer of Care Note  Patient: Alice Austin  Procedure(s) Performed: Procedure(s): DILATATION AND EVACUATION (N/A)  Patient Location: PACU  Anesthesia Type:General  Level of Consciousness: awake, alert  and oriented  Airway & Oxygen Therapy: Patient Spontanous Breathing and Patient connected to face mask oxygen  Post-op Assessment: Report given to RN and Post -op Vital signs reviewed and stable  Post vital signs: Reviewed and stable  Last Vitals:  Filed Vitals:   04/25/15 0746  BP: 129/78  Pulse: 81  Temp: 36.9 C  Resp: 20    Complications: No apparent anesthesia complications

## 2015-04-25 NOTE — H&P (View-Only) (Signed)
Subjective:    Patient is a 29 y.o. [id number] scheduled for Suction D&C. Indications for procedure are Incomplete Abortion.   Pertinent Gynecological History: SAB x 1 requiring D&C Current Incomplete AB with U/S demonstrating 6.1 week IUP with no FCA.    Menstrual History: OB History    Gravida Para Term Preterm AB TAB SAB Ectopic Multiple Living   4 1 1  2 1 1   2       Menarche age: na  Patient's last menstrual period was 03/03/2015.    Past Medical History  Diagnosis Date  . Incomplete abortion   . Hyperlipemia     Past Surgical History  Procedure Laterality Date  . Cesarean section      2015  . Dilation and curettage of uterus      2009    OB History  Gravida Para Term Preterm AB SAB TAB Ectopic Multiple Living  4 1 1  2 1 1   2     # Outcome Date GA Lbr Len/2nd Weight Sex Delivery Anes PTL Lv  4 TAB 2015          3 SAB 2009          2 Term 2007   7 lb (3.175 kg) F CS-LTranv   Y  1 Gravida 2004   6 lb 9.6 oz (2.994 kg) M CS-LTranv   Y      Social History   Social History  . Marital Status: Single    Spouse Name: N/A  . Number of Children: N/A  . Years of Education: N/A   Social History Main Topics  . Smoking status: Former Games developermoker  . Smokeless tobacco: None  . Alcohol Use: No  . Drug Use: No  . Sexual Activity: Yes    Birth Control/ Protection: None   Other Topics Concern  . None   Social History Narrative    Family History  Problem Relation Age of Onset  . Diabetes Father   . Cancer Neg Hx   . Heart disease Neg Hx      (Not in a hospital admission)  No Known Allergies  Review of Systems Constitutional: No recent fever/chills/sweats Respiratory: No recent cough/bronchitis Cardiovascular: No chest pain Gastrointestinal: No recent nausea/vomiting/diarrhea Genitourinary: No UTI symptoms Hematologic/lymphatic:No history of coagulopathy or recent blood thinner use    Objective:    BP 132/85 mmHg  Pulse 86  Ht 5' (1.524 m)   Wt 205 lb 6.4 oz (93.169 kg)  BMI 40.11 kg/m2  LMP 03/03/2015  General:   Normal  Skin:   normal  HEENT:  Normal  Neck:  Supple without Adenopathy or Thyromegaly  Lungs:   Heart:              Breasts:   Abdomen:  Pelvis:  M/S   Extremeties:  Neuro:    clear to auscultation bilaterally   Normal without murmur   Not Examined   soft, non-tender; bowel sounds normal; no masses,  no organomegaly   Exam deferred to OR  No CVAT  Warm/Dry   Normal          Assessment:      Incomplete AB  Plan:  Suction D&C   Counseling: Procedure, risks, reasons, benefits and complications (including injury to bowel, bladder, major blood vessel, ureter, bleeding, possibility of transfusion, infection, or fistula formation) reviewed in detail. Consent signed. Preop testing ordered. Instructions reviewed, including NPO after midnight.

## 2015-04-25 NOTE — Anesthesia Preprocedure Evaluation (Signed)
Anesthesia Evaluation  Patient identified by MRN, date of birth, ID band Patient awake    Reviewed: Allergy & Precautions, NPO status , Patient's Chart, lab work & pertinent test results  Airway Mallampati: II  TM Distance: >3 FB     Dental  (+) Chipped   Pulmonary former smoker,    Pulmonary exam normal breath sounds clear to auscultation       Cardiovascular negative cardio ROS Normal cardiovascular exam     Neuro/Psych Bipolar Disorder    GI/Hepatic negative GI ROS, Neg liver ROS,   Endo/Other  negative endocrine ROS  Renal/GU negative Renal ROS  negative genitourinary   Musculoskeletal negative musculoskeletal ROS (+)   Abdominal Normal abdominal exam  (+)   Peds negative pediatric ROS (+)  Hematology negative hematology ROS (+)   Anesthesia Other Findings   Reproductive/Obstetrics                             Anesthesia Physical Anesthesia Plan  ASA: II  Anesthesia Plan: General   Post-op Pain Management:    Induction: Intravenous and Rapid sequence  Airway Management Planned: Oral ETT  Additional Equipment:   Intra-op Plan:   Post-operative Plan: Extubation in OR  Informed Consent: I have reviewed the patients History and Physical, chart, labs and discussed the procedure including the risks, benefits and alternatives for the proposed anesthesia with the patient or authorized representative who has indicated his/her understanding and acceptance.   Dental advisory given  Plan Discussed with: CRNA and Surgeon  Anesthesia Plan Comments:         Anesthesia Quick Evaluation

## 2015-04-26 LAB — RPR: RPR: NONREACTIVE

## 2015-04-28 LAB — SURGICAL PATHOLOGY

## 2015-05-01 ENCOUNTER — Encounter: Payer: Self-pay | Admitting: Obstetrics and Gynecology

## 2015-05-01 ENCOUNTER — Ambulatory Visit (INDEPENDENT_AMBULATORY_CARE_PROVIDER_SITE_OTHER): Payer: Self-pay | Admitting: Obstetrics and Gynecology

## 2015-05-01 ENCOUNTER — Other Ambulatory Visit: Payer: Self-pay | Admitting: Obstetrics and Gynecology

## 2015-05-01 VITALS — BP 110/77 | HR 98 | Ht 60.0 in | Wt 208.7 lb

## 2015-05-01 DIAGNOSIS — O039 Complete or unspecified spontaneous abortion without complication: Secondary | ICD-10-CM

## 2015-05-01 DIAGNOSIS — O034 Incomplete spontaneous abortion without complication: Secondary | ICD-10-CM

## 2015-05-01 NOTE — Progress Notes (Signed)
Patient ID: Alice Austin, female   DOB: 11/14/1985, 29 y.o.   MRN: [id number]  Chief complaint: 1 week postop check. 2.  Status post suction D&C for incomplete AB   1 week post op- d&c  taking ibup q 4 Percocet as needed- 4 over the last week C/o of cramps and lite spotting  Pathology: Decidual Without villi  Patient reports having light bleeding and passage of tissue after having the ultrasound which was [facility]  Patient may have passed tissue.  OBJECTIVE: BP 110/77 mmHg  Pulse 98  Ht 5' (1.524 m)  Wt 208 lb 11.2 oz (94.666 kg)  BMI 40.76 kg/m2  LMP 03/03/2015 Physical exam-deferred.  ASSESSMENT: 1.  One week status post suction D&C for incomplete AB. 2.  Pathology did not demonstrate products of conception. 3.  Patient may have spontaneously passed tissue prior to the surgical procedure.  PLAN: 1.  Quantitative hCG today and weekly until hCG titer is less than or equal to 5 2.  Ectopic precautions given. 3.  Patient is to be notified by phone of results.  Herold HarmsMartin A Nura Cahoon, MD  Note: This dictation was prepared with Dragon dictation along with smaller phrase technology. Any transcriptional errors that result from this process are unintentional.

## 2015-05-01 NOTE — Patient Instructions (Signed)
1.Pregnancy hormone titer is drawn today.  He will be notified by phone of results. 2.  If pregnancy test is positive, he will have to have weekly tests until it drops to 0. 3.  Return as needed if he develops severe pelvic pain and vaginal bleeding

## 2015-05-02 LAB — BETA HCG QUANT (REF LAB): HCG QUANT: 137 m[IU]/mL

## 2015-05-05 ENCOUNTER — Telehealth: Payer: Self-pay

## 2015-05-05 DIAGNOSIS — O034 Incomplete spontaneous abortion without complication: Secondary | ICD-10-CM

## 2015-05-05 NOTE — Telephone Encounter (Signed)
Pt aware   Lab ordered

## 2015-05-05 NOTE — Telephone Encounter (Signed)
-----   Message from Herold HarmsMartin A Defrancesco, MD sent at 05/04/2015  8:17 PM EST ----- Please notify - Abnormal Labs Repeat Quant HCG in 1 week

## 2015-05-07 ENCOUNTER — Other Ambulatory Visit: Payer: Self-pay | Admitting: Obstetrics and Gynecology

## 2015-05-08 ENCOUNTER — Other Ambulatory Visit: Payer: Self-pay

## 2015-05-09 ENCOUNTER — Telehealth: Payer: Self-pay

## 2015-05-09 ENCOUNTER — Other Ambulatory Visit: Payer: Self-pay | Admitting: Obstetrics and Gynecology

## 2015-05-09 LAB — BETA HCG QUANT (REF LAB): hCG Quant: 13 m[IU]/mL

## 2015-05-09 NOTE — Telephone Encounter (Signed)
Pt aware. Lab appt made for 11/23.

## 2015-05-09 NOTE — Telephone Encounter (Signed)
-----   Message from Purcell NailsMelody N Shambley, [place] sent at 05/09/2015 10:22 AM EST ----- Please let her know her levels are not negative yet, i usually bring them back weekly until negative, if that is what Dr D does please schedule for her.

## 2015-05-14 ENCOUNTER — Other Ambulatory Visit: Payer: Self-pay

## 2015-06-22 ENCOUNTER — Encounter: Payer: Self-pay | Admitting: *Deleted

## 2015-06-22 ENCOUNTER — Emergency Department
Admission: EM | Admit: 2015-06-22 | Discharge: 2015-06-22 | Disposition: A | Discharge status: Home / Self Care | Payer: Self-pay | Attending: Emergency Medicine | Admitting: Emergency Medicine

## 2015-06-22 ENCOUNTER — Emergency Department: Payer: Self-pay

## 2015-06-22 DIAGNOSIS — Z791 Long term (current) use of non-steroidal anti-inflammatories (NSAID): Secondary | ICD-10-CM | POA: Insufficient documentation

## 2015-06-22 DIAGNOSIS — Z3202 Encounter for pregnancy test, result negative: Secondary | ICD-10-CM | POA: Insufficient documentation

## 2015-06-22 DIAGNOSIS — R1013 Epigastric pain: Secondary | ICD-10-CM | POA: Insufficient documentation

## 2015-06-22 DIAGNOSIS — R1084 Generalized abdominal pain: Secondary | ICD-10-CM | POA: Insufficient documentation

## 2015-06-22 DIAGNOSIS — R1030 Lower abdominal pain, unspecified: Secondary | ICD-10-CM | POA: Insufficient documentation

## 2015-06-22 DIAGNOSIS — R1011 Right upper quadrant pain: Secondary | ICD-10-CM | POA: Insufficient documentation

## 2015-06-22 DIAGNOSIS — R110 Nausea: Secondary | ICD-10-CM | POA: Insufficient documentation

## 2015-06-22 DIAGNOSIS — Z87891 Personal history of nicotine dependence: Secondary | ICD-10-CM | POA: Insufficient documentation

## 2015-06-22 LAB — COMPREHENSIVE METABOLIC PANEL
ALK PHOS: 76 U/L (ref 38–126)
ALT: 29 U/L (ref 14–54)
ANION GAP: 7 (ref 5–15)
AST: 21 U/L (ref 15–41)
Albumin: 3.8 g/dL (ref 3.5–5.0)
BUN: 16 mg/dL (ref 6–20)
CALCIUM: 8.9 mg/dL (ref 8.9–10.3)
CO2: 25 mmol/L (ref 22–32)
Chloride: 104 mmol/L (ref 101–111)
Creatinine, Ser: 0.54 mg/dL (ref 0.44–1.00)
GFR calc non Af Amer: >60 mL/min (ref >60)
Glucose, Bld: 111 mg/dL — ABNORMAL HIGH (ref 65–99)
Potassium: 3.7 mmol/L (ref 3.5–5.1)
SODIUM: 136 mmol/L (ref 135–145)
Total Bilirubin: 0.5 mg/dL (ref 0.3–1.2)
Total Protein: 7 g/dL (ref 6.5–8.1)

## 2015-06-22 LAB — CBC WITH DIFFERENTIAL/PLATELET
Basophils Absolute: 0.1 10*3/uL (ref 0–0.1)
Basophils Relative: 1 %
EOS ABS: 0.2 10*3/uL (ref 0–0.7)
EOS PCT: 1 %
HCT: 37.7 % (ref 35.0–47.0)
HEMOGLOBIN: 12.9 g/dL (ref 12.0–16.0)
LYMPHS PCT: 28 %
Lymphs Abs: 2.9 10*3/uL (ref 1.0–3.6)
MCH: 28.9 pg (ref 26.0–34.0)
MCHC: 34.2 g/dL (ref 32.0–36.0)
MCV: 84.6 fL (ref 80.0–100.0)
MONOS PCT: 8 %
Monocytes Absolute: 0.8 10*3/uL (ref 0.2–0.9)
NEUTROS ABS: 6.6 10*3/uL — AB (ref 1.4–6.5)
Neutrophils Relative %: 62 %
PLATELETS: 300 10*3/uL (ref 150–440)
RBC: 4.45 MIL/uL (ref 3.80–5.20)
RDW: 14.3 % (ref 11.5–14.5)
WBC: 10.5 10*3/uL (ref 3.6–11.0)

## 2015-06-22 LAB — URINALYSIS COMPLETE WITH MICROSCOPIC (ARMC ONLY)
BILIRUBIN URINE: NEGATIVE
GLUCOSE, UA: NEGATIVE mg/dL
Hgb urine dipstick: NEGATIVE
KETONES UR: NEGATIVE mg/dL
LEUKOCYTES UA: NEGATIVE
NITRITE: NEGATIVE
PH: 6 (ref 5.0–8.0)
Protein, ur: NEGATIVE mg/dL
Specific Gravity, Urine: 1.02 (ref 1.005–1.030)

## 2015-06-22 LAB — POCT PREGNANCY, URINE: Preg Test, Ur: NEGATIVE

## 2015-06-22 MED ORDER — DOCUSATE SODIUM 100 MG PO CAPS: ORAL_CAPSULE | ORAL | Status: DC

## 2015-06-22 MED ORDER — ONDANSETRON HCL 4 MG/2ML IJ SOLN
4.0000 mg | Freq: Once | INTRAMUSCULAR | Status: AC
  Administered 2015-06-22: 4 mg via INTRAVENOUS
  Filled 2015-06-22: qty 2

## 2015-06-22 MED ORDER — IOHEXOL 240 MG/ML SOLN
25.0000 mL | Freq: Once | INTRAMUSCULAR | Status: AC | PRN
  Administered 2015-06-22: 25 mL via ORAL

## 2015-06-22 MED ORDER — HYDROCODONE-ACETAMINOPHEN 5-325 MG PO TABS: 1.0000 | ORAL_TABLET | ORAL | Status: DC | PRN

## 2015-06-22 MED ORDER — SODIUM CHLORIDE 0.9 % IV BOLUS (SEPSIS)
500.0000 mL | INTRAVENOUS | Status: AC
Start: 2015-06-22 — End: 2015-06-22
  Administered 2015-06-22: 500 mL via INTRAVENOUS

## 2015-06-22 MED ORDER — IOHEXOL 300 MG/ML  SOLN
100.0000 mL | Freq: Once | INTRAMUSCULAR | Status: AC | PRN
  Administered 2015-06-22: 100 mL via INTRAVENOUS

## 2015-06-22 MED ORDER — ONDANSETRON HCL 4 MG PO TABS: ORAL_TABLET | ORAL | Status: DC

## 2015-06-22 MED ORDER — MORPHINE SULFATE (PF) 4 MG/ML IV SOLN
4.0000 mg | Freq: Once | INTRAVENOUS | Status: AC
  Administered 2015-06-22: 4 mg via INTRAVENOUS
  Filled 2015-06-22: qty 1

## 2015-06-22 MED ORDER — OXYCODONE-ACETAMINOPHEN 5-325 MG PO TABS
2.0000 | ORAL_TABLET | Freq: Once | ORAL | Status: AC
  Administered 2015-06-22: 2 via ORAL
  Filled 2015-06-22: qty 2

## 2015-06-22 NOTE — Discharge Instructions (Signed)
You have been seen in the Emergency Department (ED) for abdominal pain.  Your evaluation did not identify a clear cause of your symptoms but was generally reassuring. ° °Please follow up as instructed above regarding today’s emergent visit and the symptoms that are bothering you. ° °Return to the ED if your abdominal pain worsens or fails to improve, you develop bloody vomiting, bloody diarrhea, you are unable to tolerate fluids due to vomiting, fever greater than 101, or other symptoms that concern you. ° ° °Abdominal Pain, Adult °Many things can cause abdominal pain. Usually, abdominal pain is not caused by a disease and will improve without treatment. It can often be observed and treated at home. Your health care provider will do a physical exam and possibly order blood tests and X-rays to help determine the seriousness of your pain. However, in many cases, more time must pass before a clear cause of the pain can be found. Before that point, your health care provider may not know if you need more testing or further treatment. °HOME CARE INSTRUCTIONS °Monitor your abdominal pain for any changes. The following actions may help to alleviate any discomfort you are experiencing: °· Only take over-the-counter or prescription medicines as directed by your health care provider. °· Do not take laxatives unless directed to do so by your health care provider. °· Try a clear liquid diet (broth, tea, or water) as directed by your health care provider. Slowly move to a bland diet as tolerated. °SEEK MEDICAL CARE IF: °· You have unexplained abdominal pain. °· You have abdominal pain associated with nausea or diarrhea. °· You have pain when you urinate or have a bowel movement. °· You experience abdominal pain that wakes you in the night. °· You have abdominal pain that is worsened or improved by eating food. °· You have abdominal pain that is worsened with eating fatty foods. °· You have a fever. °SEEK IMMEDIATE MEDICAL CARE  IF: °· Your pain does not go away within 2 hours. °· You keep throwing up (vomiting). °· Your pain is felt only in portions of the abdomen, such as the right side or the left lower portion of the abdomen. °· You pass bloody or black tarry stools. °MAKE SURE YOU: °· Understand these instructions. °· Will watch your condition. °· Will get help right away if you are not doing well or get worse. °  °This information is not intended to replace advice given to you by your health care provider. Make sure you discuss any questions you have with your health care provider. °  °Document Released: 03/17/2005 Document Revised: 02/26/2015 Document Reviewed: 02/14/2013 °Elsevier Interactive Patient Education ©2016 [employer] ° °

## 2015-06-22 NOTE — ED Notes (Addendum)
Pt states she had an abortion in late October and has not had a menstrual cycle since. Pt describes the pain at like having "contractions and it comes and goes" and is across the bottom of her lower stomach. Pt states that days before the pain started she felt "dizzy like she might pass out".

## 2015-06-22 NOTE — ED Notes (Signed)
poct pregnancy Negative 

## 2015-06-22 NOTE — ED Provider Notes (Signed)
[facility] Emergency Department Provider Note  ____________________________________________  Time seen: Approximately 5:01 PM  I have reviewed the triage vital signs and the nursing notes.   HISTORY  Chief Complaint Abdominal Pain    HPI [name] is a 30 y.o. female G5 P3 spontaneous abortion 1 elective abortion 1 (approximately 2-1/2 months ago status post D&C) who presents with acute onset of severe abdominal pain today.  She has also had persistent nausea for months but that is unchanged today.  She describes the pain as a severe cramping in her lower abdomen that radiates through to her back.  She denies vaginal bleeding, vaginal discharge, dyspareunia.  She was seen in clinic one week ago and had a Pap smear and sexually transmitted infection tests all of which were negative.  She denies dysuria.  She states that her pain is severe and that it is worse with movement and pushing on her abdomen.  She denies fever/chills, chest pain, shortness of breath.   Past Medical History  Diagnosis Date  . Incomplete abortion   . Hyperlipemia     Patient Active Problem List   Diagnosis Date Noted  . 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 Tendinitis of wrist 10/04/2013  . Bipolar affective disorder (HCC) 06/25/2013  . 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 H/O previous obstetrical problem 06/06/2013  . High-risk pregnancy 06/06/2013  . Adult BMI 30+ 06/06/2013    Past Surgical History  Procedure Laterality Date  . Cesarean section      2015  . Dilation and curettage of uterus      2009  . Dilation and evacuation N/A 04/25/2015    Procedure: DILATATION AND EVACUATION;  Surgeon: Herold HarmsMartin A Defrancesco, MD;  Location: [facility] ORS;  Service: Gynecology;  Laterality: N/A;    Current Outpatient Rx  Name  Route  Sig  Dispense  Refill  . docusate sodium (COLACE) 100 MG capsule      Take 1 tablet once or twice daily as needed for constipation while taking narcotic  pain medicine   30 capsule   0   . HYDROcodone-acetaminophen (NORCO/VICODIN) 5-325 MG tablet   Oral   Take 1-2 tablets by mouth every 4 (four) hours as needed for moderate pain.   15 tablet   0   . ibuprofen (ADVIL,MOTRIN) 800 MG tablet   Oral   Take 1 tablet (800 mg total) by mouth 3 (three) times daily.   30 tablet   1   . ondansetron (ZOFRAN) 4 MG tablet      Take 1-2 tabs by mouth every 8 hours as needed for nausea/vomiting   30 tablet   0   . oxyCODONE-acetaminophen (ROXICET) 5-325 MG tablet   Oral   Take 1 tablet by mouth every 6 (six) hours as needed for severe pain.   25 tablet   0     Allergies Review of patient's allergies indicates no known allergies.  Family History  Problem Relation Age of Onset  . Diabetes Father   . Cancer Neg Hx   . Heart disease Neg Hx     Social History Social History  Substance Use Topics  . Smoking status: Former Games developermoker  . Smokeless tobacco: None  . Alcohol Use: No    Review of Systems Constitutional: No fever/chills Eyes: No visual changes. ENT: No sore throat. Cardiovascular: Denies chest pain. Respiratory: Denies shortness of breath. Gastrointestinal: Severe acute onset lower abdominal cramping that radiates through to her back. Genitourinary: Negative for dysuria.  No vaginal bleeding or discharge. Musculoskeletal: Negative for back pain. Skin: Negative for rash. Neurological: Negative for headaches, focal weakness or numbness.  10-point ROS otherwise negative.  ____________________________________________   PHYSICAL EXAM:  VITAL SIGNS: ED Triage Vitals  Enc Vitals Group     BP 06/22/15 1509 119/91 mmHg     Pulse Rate 06/22/15 1509 89     Resp 06/22/15 1509 20     Temp 06/22/15 1509 98.7 F (37.1 C)     Temp Source 06/22/15 1509 Oral     SpO2 06/22/15 1509 99 %     Weight 06/22/15 1509 210 lb (95.255 kg)     Height 06/22/15 1509 5' (1.524 m)     Head Cir --      Peak Flow --      Pain Score  06/22/15 1510 7     Pain Loc --      Pain Edu? --      Excl. in GC? --     Constitutional: Alert and oriented. Well appearing and in no acute distress. Eyes: Conjunctivae are normal. PERRL. EOMI. Head: Atraumatic. Nose: No congestion/rhinnorhea. Mouth/Throat: Mucous membranes are moist.  Oropharynx non-erythematous. Neck: No stridor.   Cardiovascular: Normal rate, regular rhythm. Grossly normal heart sounds.  Good peripheral circulation. Respiratory: Normal respiratory effort.  No retractions. Lungs CTAB. Gastrointestinal: Soft with tenderness palpation throughout the abdomen.  It is difficult to localize.  She has pain in the right upper quadrant, epigastrium, and all throughout the lower abdomen.  It is not specific to right upper or to McBurney's point. Musculoskeletal: No lower extremity tenderness nor edema.  No joint effusions. Neurologic:  Normal speech and language. No gross focal neurologic deficits are appreciated.  Skin:  Skin is warm, dry and intact. No rash noted.   ____________________________________________   LABS (all labs ordered are listed, but only abnormal results are displayed)  Labs Reviewed  CBC WITH DIFFERENTIAL/PLATELET - Abnormal; Notable for the following:    Neutro Abs 6.6 (*)    All other components within normal limits  COMPREHENSIVE METABOLIC PANEL - Abnormal; Notable for the following:    Glucose, Bld 111 (*)    All other components within normal limits  URINALYSIS COMPLETEWITH MICROSCOPIC ([facility] ONLY) - Abnormal; Notable for the following:    Color, Urine YELLOW (*)    APPearance CLOUDY (*)    Bacteria, UA RARE (*)    Squamous Epithelial / LPF 6-30 (*)    All other components within normal limits  POC URINE PREG, ED  POCT PREGNANCY, URINE   ____________________________________________  EKG  Not indicated ____________________________________________  RADIOLOGY   Ct Abdomen Pelvis W Contrast  06/22/2015  CLINICAL DATA:  Abdominal  pelvic pain starting at 5 a.m. today. EXAM: CT ABDOMEN AND PELVIS WITH CONTRAST TECHNIQUE: Multidetector CT imaging of the abdomen and pelvis was performed using the standard protocol following bolus administration of intravenous contrast. CONTRAST:  OMNIPAQUE IOHEXOL 300 MG/ML SOLN, 25mL OMNIPAQUE IOHEXOL 240 MG/ML SOLN COMPARISON:  None. FINDINGS: Lower chest:  Trace atelectasis noted in the posterior lingula. Hepatobiliary: No focal abnormality within the liver parenchyma. There is no evidence for gallstones, gallbladder wall thickening, or pericholecystic fluid. No intrahepatic or extrahepatic biliary dilation. Pancreas: No focal mass lesion. No dilatation of the main duct. No intraparenchymal cyst. No peripancreatic edema. Spleen: No splenomegaly. No focal mass lesion. Adrenals/Urinary Tract: No adrenal nodule or mass. Kidneys are unremarkable. No evidence for hydroureter. The urinary bladder appears normal for the degree  of distention. Stomach/Bowel: Small hiatal hernia noted. Stomach otherwise normal in appearance. Duodenum is normally positioned as is the ligament of Treitz. No small bowel wall thickening. No small bowel dilatation. The terminal ileum is normal. The appendix is normal. No gross colonic mass. No colonic wall thickening. No substantial diverticular change. Vascular/Lymphatic: No abdominal aortic aneurysm. No abdominal atherosclerotic calcification. There is no gastrohepatic or hepatoduodenal ligament lymphadenopathy. No intraperitoneal or retroperitoneal lymphadenopy. No pelvic sidewall lymphadenopathy. Reproductive: 2.6 x 2.5 x 2.8 cm low-density lesion in the anterior uterus is compatible with intramural fibroid. A 5 cm lesion was seen at this location on MRI of 05/27/2013. There is no adnexal mass. Other: No intraperitoneal free fluid. Musculoskeletal: Small umbilical hernia contains only fat. Bone windows reveal no worrisome lytic or sclerotic osseous lesions. IMPRESSION: 1. No acute  findings in the abdomen or pelvis. Specifically, no evidence to explain the patient's history of pain. 2. No evidence for endometrial fluid. Anterior intramural fibroid has decreased slightly in size in the interval. 3. Small umbilical hernia contains only fat. Electronically Signed   By: Kennith Center M.D.   On: 06/22/2015 18:39    ____________________________________________   PROCEDURES  Procedure(s) performed: None  Critical Care performed: No ____________________________________________   INITIAL IMPRESSION / ASSESSMENT AND PLAN / ED COURSE  Pertinent labs & imaging results that were available during my care of the patient were reviewed by me and considered in my medical decision making (see chart for details).  The patient's vital signs are stable and her lab work is unremarkable including her urine.  However she has severe tenderness to palpation all throughout her abdomen and the differential is broad and includes appendicitis, reproductive, gallbladder, etc.  The best way for me to get the most Recent results for the patient is to obtain a CT scan with by mouth and IV contrast.  I will also treat her pain and nausea with morphine and Zofran.  The patient understands and agrees with plan.  ----------------------------------------- 7:10 PM on 06/22/2015 -----------------------------------------  The patient's workup and a completely unremarkable with normal labs, no evidence of UTI, no evidence of abnormality on her CT scan.  As mentioned above she had a pelvic exam and negative STD testing less than a week ago and I do not believe is necessary to repeat it at this time.  I gave the patient my usual and customary discharge instructions and recommendations for generalized abdominal pain and she understands to return if she develops new or worsening symptoms.  ____________________________________________  FINAL CLINICAL IMPRESSION(S) / ED DIAGNOSES  Final diagnoses:  Generalized  abdominal pain      NEW MEDICATIONS STARTED DURING THIS VISIT:  New Prescriptions   DOCUSATE SODIUM (COLACE) 100 MG CAPSULE    Take 1 tablet once or twice daily as needed for constipation while taking narcotic pain medicine   HYDROCODONE-ACETAMINOPHEN (NORCO/VICODIN) 5-325 MG TABLET    Take 1-2 tablets by mouth every 4 (four) hours as needed for moderate pain.   ONDANSETRON (ZOFRAN) 4 MG TABLET    Take 1-2 tabs by mouth every 8 hours as needed for nausea/vomiting     Loleta Rose, MD 06/22/15 1916

## 2015-06-22 NOTE — ED Notes (Signed)
Pt reports low abd pain since last night.  No vag bleeding or discharge.  Denies dysuria. Pt had D and C in Nov. 2016.  No menses since procedure.

## 2015-11-07 LAB — GLUCOSE, POCT (MANUAL RESULT ENTRY): POC GLUCOSE: 117 mg/dL — AB (ref 70–99)

## 2016-02-25 ENCOUNTER — Encounter (HOSPITAL_COMMUNITY): Payer: Self-pay | Admitting: Emergency Medicine

## 2016-02-25 ENCOUNTER — Emergency Department (HOSPITAL_COMMUNITY)
Admission: EM | Admit: 2016-02-25 | Discharge: 2016-02-25 | Disposition: A | Discharge status: Home / Self Care | Payer: Self-pay | Attending: Emergency Medicine | Admitting: Emergency Medicine

## 2016-02-25 ENCOUNTER — Emergency Department (HOSPITAL_COMMUNITY): Payer: Self-pay

## 2016-02-25 DIAGNOSIS — M79604 Pain in right leg: Secondary | ICD-10-CM | POA: Insufficient documentation

## 2016-02-25 DIAGNOSIS — M79605 Pain in left leg: Secondary | ICD-10-CM | POA: Insufficient documentation

## 2016-02-25 DIAGNOSIS — R1011 Right upper quadrant pain: Secondary | ICD-10-CM | POA: Insufficient documentation

## 2016-02-25 DIAGNOSIS — R3915 Urgency of urination: Secondary | ICD-10-CM | POA: Insufficient documentation

## 2016-02-25 DIAGNOSIS — R350 Frequency of micturition: Secondary | ICD-10-CM | POA: Insufficient documentation

## 2016-02-25 DIAGNOSIS — R091 Pleurisy: Secondary | ICD-10-CM | POA: Insufficient documentation

## 2016-02-25 DIAGNOSIS — Z87891 Personal history of nicotine dependence: Secondary | ICD-10-CM | POA: Insufficient documentation

## 2016-02-25 DIAGNOSIS — Z791 Long term (current) use of non-steroidal anti-inflammatories (NSAID): Secondary | ICD-10-CM | POA: Insufficient documentation

## 2016-02-25 DIAGNOSIS — Z79899 Other long term (current) drug therapy: Secondary | ICD-10-CM | POA: Insufficient documentation

## 2016-02-25 DIAGNOSIS — M546 Pain in thoracic spine: Secondary | ICD-10-CM | POA: Insufficient documentation

## 2016-02-25 DIAGNOSIS — R1013 Epigastric pain: Secondary | ICD-10-CM | POA: Insufficient documentation

## 2016-02-25 DIAGNOSIS — R197 Diarrhea, unspecified: Secondary | ICD-10-CM | POA: Insufficient documentation

## 2016-02-25 LAB — URINALYSIS, ROUTINE W REFLEX MICROSCOPIC
BILIRUBIN URINE: NEGATIVE
GLUCOSE, UA: NEGATIVE mg/dL
Hgb urine dipstick: NEGATIVE
KETONES UR: NEGATIVE mg/dL
LEUKOCYTES UA: NEGATIVE
NITRITE: NEGATIVE
PH: 5.5 (ref 5.0–8.0)
PROTEIN: NEGATIVE mg/dL
Specific Gravity, Urine: 1.028 (ref 1.005–1.030)

## 2016-02-25 LAB — COMPREHENSIVE METABOLIC PANEL WITH GFR
ALT: 22 U/L (ref 14–54)
AST: 23 U/L (ref 15–41)
Albumin: 3.7 g/dL (ref 3.5–5.0)
Alkaline Phosphatase: 54 U/L (ref 38–126)
Anion gap: 7 (ref 5–15)
BUN: 17 mg/dL (ref 6–20)
CO2: 24 mmol/L (ref 22–32)
Calcium: 9 mg/dL (ref 8.9–10.3)
Chloride: 105 mmol/L (ref 101–111)
Creatinine, Ser: 0.53 mg/dL (ref 0.44–1.00)
GFR calc Af Amer: >60 mL/min
GFR calc non Af Amer: >60 mL/min
Glucose, Bld: 107 mg/dL — ABNORMAL HIGH (ref 65–99)
Potassium: 3.7 mmol/L (ref 3.5–5.1)
Sodium: 136 mmol/L (ref 135–145)
Total Bilirubin: 0.3 mg/dL (ref 0.3–1.2)
Total Protein: 7.5 g/dL (ref 6.5–8.1)

## 2016-02-25 LAB — CBC WITH DIFFERENTIAL/PLATELET
BASOS PCT: 1 %
Basophils Absolute: 0 10*3/uL (ref 0.0–0.1)
EOS ABS: 0.1 10*3/uL (ref 0.0–0.7)
EOS PCT: 1 %
HCT: 36.6 % (ref 36.0–46.0)
Hemoglobin: 12.1 g/dL (ref 12.0–15.0)
LYMPHS ABS: 2.4 10*3/uL (ref 0.7–4.0)
Lymphocytes Relative: 28 %
MCH: 27.6 pg (ref 26.0–34.0)
MCHC: 33.1 g/dL (ref 30.0–36.0)
MCV: 83.4 fL (ref 78.0–100.0)
MONOS PCT: 7 %
Monocytes Absolute: 0.6 10*3/uL (ref 0.1–1.0)
Neutro Abs: 5.4 10*3/uL (ref 1.7–7.7)
Neutrophils Relative %: 63 %
PLATELETS: 242 10*3/uL (ref 150–400)
RBC: 4.39 MIL/uL (ref 3.87–5.11)
RDW: 14.3 % (ref 11.5–15.5)
WBC: 8.5 10*3/uL (ref 4.0–10.5)

## 2016-02-25 LAB — D-DIMER, QUANTITATIVE: D-Dimer, Quant: <0.27 ug{FEU}/mL (ref 0.00–0.50)

## 2016-02-25 LAB — POC URINE PREG, ED: Preg Test, Ur: NEGATIVE

## 2016-02-25 LAB — LIPASE, BLOOD: Lipase: 26 U/L (ref 11–51)

## 2016-02-25 MED ORDER — MORPHINE SULFATE (PF) 4 MG/ML IV SOLN
4.0000 mg | Freq: Once | INTRAVENOUS | Status: AC
Start: 2016-02-25 — End: 2016-02-25
  Administered 2016-02-25: 4 mg via INTRAVENOUS
  Filled 2016-02-25: qty 1

## 2016-02-25 MED ORDER — CYCLOBENZAPRINE HCL 10 MG PO TABS: 10.0000 mg | ORAL_TABLET | Freq: Two times a day (BID) | ORAL | 0 refills | Status: DC | PRN

## 2016-02-25 NOTE — Discharge Instructions (Signed)
We could not identify any causes of infection which might be causing your pain. It is most likely that this is related to inflammation in the muscles of your back. We recommend that you take ibuprofen for pain relief and to reduce inflammation. We will also prescribe a muscle relaxant which you can take as needed for significant pain.  You should follow up with your primary doctor if you symptoms do not resolve in 5-7 days.

## 2016-02-25 NOTE — ED Provider Notes (Signed)
[facility]ERG[facility] DEPT Provider Note   CSN: [id number] Arrival date & time: 02/25/16  5621     History   Chief Complaint Chief Complaint  Patient presents with  . Back Pain  . Urinary Frequency    HPI Alice Austin is a 30 y.o. female.  Patient with PMH significant for hyperlipidemia presents with four-day history of diffuse back pain most significant in the right thoracic region. She notes that she was in bed on Saturday evening when she became acutely short of breath and sat up. Upon sitting she had extreme sharp pain in her mid back. This pain is worsened by movement, worsened by deep breathing, and she has significant limited range of motion. Pain is most prominent in the right thoracic region and radiates up to her shoulder blade. But she has significant tenderness to palpation over the paraspinal throughout her upper and lower back. That she has had a history of kidney infection which presented with similar pain, as well as history of pneumonia 2 years ago which had similar pain. She has no history of PE. She denies birth control use, denies tobacco use, denies prolonged immobility, recent surgery, long trips. She denies any chest pain, abdominal pain, constipation. Patient does note some increased frequency of urination, with hesitancy, and a change in the smell of her urine. She denies any burning or hematuria. Patient also notes some diarrhea which began yesterday, but denies any blood in stool. Patient is not had fevers or chills. Yesterday she took Zanaflex at home in order to help her sleep and had mild success with this therapy. She has not taken any other medication.      Past Medical History:  Diagnosis Date  . Hyperlipemia   . Incomplete abortion     Patient Active Problem List   Diagnosis Date Noted  . 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 Tendinitis of wrist 10/04/2013  . Bipolar affective disorder (HCC) 06/25/2013  . 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 H/O previous  obstetrical problem 06/06/2013  . High-risk pregnancy 06/06/2013  . Adult BMI 30+ 06/06/2013    Past Surgical History:  Procedure Laterality Date  . CESAREAN SECTION     2015  . DILATION AND CURETTAGE OF UTERUS     2009  . DILATION AND EVACUATION N/A 04/25/2015   Procedure: DILATATION AND EVACUATION;  Surgeon: Herold Harms, MD;  Location: [facility] ORS;  Service: Gynecology;  Laterality: N/A;    OB History    Gravida Para Term Preterm AB Living   4 1 1   2 2    SAB TAB Ectopic Multiple Live Births   1 1     2        Home Medications    Prior to Admission medications   Medication Sig Start Date End Date Taking? Authorizing Provider  naproxen sodium (ANAPROX) 220 MG tablet Take 440 mg by mouth 2 (two) times daily with a meal.   Yes Historical Provider, MD  norethindrone-ethinyl estradiol-iron (ESTROSTEP FE,TILIA FE,TRI-LEGEST FE) 1-20/1-30/1-35 MG-MCG tablet Take 1 tablet by mouth daily.   Yes Historical Provider, MD  docusate sodium (COLACE) 100 MG capsule Take 1 tablet once or twice daily as needed for constipation while taking narcotic pain medicine Patient not taking: Reported on 02/25/2016 06/22/15   Loleta Rose, MD  HYDROcodone-acetaminophen (NORCO/VICODIN) 5-325 MG tablet Take 1-2 tablets by mouth every 4 (four) hours as needed for moderate pain. Patient not taking: Reported on 02/25/2016 06/22/15   Loleta Rose, MD  ibuprofen (ADVIL,MOTRIN) 800  MG tablet Take 1 tablet (800 mg total) by mouth 3 (three) times daily. Patient not taking: Reported on 02/25/2016 04/25/15   Prentice DockerMartin A Defrancesco, MD  ondansetron (ZOFRAN) 4 MG tablet Take 1-2 tabs by mouth every 8 hours as needed for nausea/vomiting Patient not taking: Reported on 02/25/2016 06/22/15   Loleta Roseory Forbach, MD  oxyCODONE-acetaminophen (ROXICET) 5-325 MG tablet Take 1 tablet by mouth every 6 (six) hours as needed for severe pain. Patient not taking: Reported on 02/25/2016 04/24/15   Herold HarmsMartin A Defrancesco, MD    Family History Family  History  Problem Relation Age of Onset  . Diabetes Father   . Cancer Neg Hx   . Heart disease Neg Hx     Social History Social History  Substance Use Topics  . Smoking status: Former Games developermoker  . Smokeless tobacco: Never Used  . Alcohol use No     Allergies   Review of patient's allergies indicates no known allergies.   Review of Systems Review of Systems  Constitutional: Negative for chills and fever.  HENT: Negative for congestion and sore throat.   Eyes: Negative for pain and visual disturbance.  Respiratory: Positive for shortness of breath (2/2 pain). Negative for cough.   Cardiovascular: Negative for chest pain, palpitations and leg swelling.  Gastrointestinal: Positive for diarrhea. Negative for abdominal pain, blood in stool, nausea and vomiting.  Genitourinary: Positive for flank pain, frequency and urgency. Negative for dysuria and hematuria.  Musculoskeletal: Positive for back pain and myalgias. Negative for arthralgias, neck pain and neck stiffness.  Skin: Negative for color change and rash.  Neurological: Negative for dizziness, seizures, syncope and headaches.  All other systems reviewed and are negative.    Physical Exam Updated Vital Signs BP 110/80 (BP Location: Right Arm)   Pulse 77   Temp 98.3 F (36.8 C) (Oral)   Resp 16   LMP 09/25/2015   SpO2 98%   Physical Exam  Constitutional: She is oriented to person, place, and time. She appears well-developed. She appears distressed (mild).  HENT:  Head: Normocephalic and atraumatic.  Mouth/Throat: Oropharynx is clear and moist.  Eyes: Conjunctivae are normal.  Neck: Normal range of motion. Neck supple.  Cardiovascular: Normal rate, regular rhythm, normal heart sounds and intact distal pulses.   No murmur heard. Pulmonary/Chest: Effort normal and breath sounds normal. No respiratory distress. She has no wheezes. She has no rales. She exhibits no tenderness.  Abdominal: Soft. She exhibits no distension.  Bowel sounds are decreased. There is no hepatosplenomegaly. There is tenderness in the epigastric area. There is CVA tenderness. There is no rigidity, no rebound and no guarding.  Musculoskeletal: She exhibits tenderness (bilateral calf TTP). She exhibits no edema.  Neurological: She is alert and oriented to person, place, and time.  Skin: Skin is warm and dry.  Psychiatric: She has a normal mood and affect.  Nursing note and vitals reviewed.    ED Treatments / Results  Labs (all labs ordered are listed, but only abnormal results are displayed) Labs Reviewed  URINALYSIS, ROUTINE W REFLEX MICROSCOPIC (NOT AT [facility]) - Abnormal; Notable for the following:       Result Value   APPearance CLOUDY (*)    All other components within normal limits  CBC WITH DIFFERENTIAL/PLATELET  D-DIMER, QUANTITATIVE (NOT AT [facility])  COMPREHENSIVE METABOLIC PANEL  LIPASE, BLOOD  POC URINE PREG, ED    EKG  EKG Interpretation None       Radiology No results found.  Procedures Procedures (  including critical care time)  Medications Ordered in ED Medications - No data to display   Initial Impression / Assessment and Plan / ED Course  I have reviewed the triage vital signs and the nursing notes.  Pertinent labs & imaging results that were available during my care of the patient were reviewed by me and considered in my medical decision making (see chart for details).  Clinical Course  Value Comment By Time   Pt seen and evaluated. Urine is clean and UTI is low on the differential. Will begin w/u to r/o PE vs PNA vs GB dz. Carolynn Comment, MD 09/06 0960  D-Dimer, Quant: <0.27 Negative w/ low probability for PE Carolynn Comment, MD 09/06 1106    Labwork unrevealing for causes of back pain. No signs of infection or PE. Suspect MSK vs pleurisy. Will plan to DC w/ pain control and f/u at PCP.  Final Clinical Impressions(s) / ED Diagnoses   Final diagnoses:  None    New Prescriptions New Prescriptions    No medications on file     Carolynn Comment, MD 02/25/16 1209    Linwood Dibbles, MD 02/26/16 [phone]

## 2016-02-25 NOTE — ED Provider Notes (Signed)
Pt is a 30 y.o. female who presents with  Chief Complaint  Patient presents with  . Back Pain  . Urinary Frequency    Physical Exam  Constitutional: No distress.  HENT:  Head: Normocephalic and atraumatic.  Eyes: Conjunctivae are normal. Left eye exhibits no discharge. No scleral icterus.  Neck: No tracheal deviation present. No thyromegaly present.  Pulmonary/Chest: Effort normal and breath sounds normal. No stridor.  Abdominal: She exhibits no distension. There is tenderness in the right upper quadrant and epigastric area.  Musculoskeletal: She exhibits no tenderness or deformity.       Arms: Neurological: She is alert.  Skin: Skin is warm. No rash noted. She is not diaphoretic. No erythema.  Psychiatric: Affect normal.    Clinical Course  Value Comment By Time   Pt seen and evaluated. Urine is clean and UTI is low on the differential. Will begin w/u to r/o PE vs PNA vs GB dz. Carolynn CommentBryan Strelow, MD 09/06 [id number]  D-Dimer, Quant: <0.27 Negative w/ low probability for PE Carolynn CommentBryan Strelow, MD 09/06 1106    Suspect her symptoms are related to musculoskeletal back pain. Stable for discharge. We'll treat symptomatically  Bilateral thoracic back pain  Pleurisy   Medical screening examination/treatment/procedure(s) were conducted as a shared visit with non-physician practitioner(s) and myself.  I personally evaluated the patient during the encounter.       Linwood DibblesJon Xiao Graul, MD 02/25/16 1620

## 2016-02-25 NOTE — ED Triage Notes (Signed)
Pt c/o standing up and experiencing a sharp pain bilaterally in her mid back since Saturday. Pt sts it took her breath away when it initially happened, denies injury or heavy lifting. Pt denies SOB, but sts that when she takes deep breaths her back pain is worse. Pt also c/o worsening pain with movement. Pt c/o urinary frequency and not emptying her bladder when she uses the bathroom. Pt sts urinary symptoms started at the same time as the back pain. A&Ox4 and ambulatory. Denies chest pain.

## 2016-02-25 NOTE — Progress Notes (Signed)
During 02/25/16 pt had confirmed with ED CM she is still an active pt at [facility]

## 2016-03-23 ENCOUNTER — Encounter (HOSPITAL_COMMUNITY): Payer: Self-pay | Admitting: Emergency Medicine

## 2016-03-23 ENCOUNTER — Emergency Department (HOSPITAL_COMMUNITY)
Admission: EM | Admit: 2016-03-23 | Discharge: 2016-03-23 | Disposition: A | Discharge status: Home / Self Care | Payer: Self-pay | Attending: Emergency Medicine | Admitting: Emergency Medicine

## 2016-03-23 ENCOUNTER — Emergency Department (HOSPITAL_COMMUNITY): Payer: Self-pay

## 2016-03-23 DIAGNOSIS — Y929 Unspecified place or not applicable: Secondary | ICD-10-CM | POA: Insufficient documentation

## 2016-03-23 DIAGNOSIS — S4991XA Unspecified injury of right shoulder and upper arm, initial encounter: Secondary | ICD-10-CM

## 2016-03-23 DIAGNOSIS — Z79899 Other long term (current) drug therapy: Secondary | ICD-10-CM | POA: Insufficient documentation

## 2016-03-23 DIAGNOSIS — Y9389 Activity, other specified: Secondary | ICD-10-CM | POA: Insufficient documentation

## 2016-03-23 DIAGNOSIS — S51831A Puncture wound without foreign body of right forearm, initial encounter: Secondary | ICD-10-CM | POA: Insufficient documentation

## 2016-03-23 DIAGNOSIS — Y999 Unspecified external cause status: Secondary | ICD-10-CM | POA: Insufficient documentation

## 2016-03-23 DIAGNOSIS — W458XXA Other foreign body or object entering through skin, initial encounter: Secondary | ICD-10-CM | POA: Insufficient documentation

## 2016-03-23 DIAGNOSIS — Z87891 Personal history of nicotine dependence: Secondary | ICD-10-CM | POA: Insufficient documentation

## 2016-03-23 DIAGNOSIS — T148XXA Other injury of unspecified body region, initial encounter: Secondary | ICD-10-CM

## 2016-03-23 DIAGNOSIS — Z23 Encounter for immunization: Secondary | ICD-10-CM | POA: Insufficient documentation

## 2016-03-23 MED ORDER — HYDROCODONE-ACETAMINOPHEN 5-325 MG PO TABS: 1.0000 | ORAL_TABLET | Freq: Four times a day (QID) | ORAL | 0 refills | Status: DC | PRN

## 2016-03-23 MED ORDER — IBUPROFEN 800 MG PO TABS: 800.0000 mg | ORAL_TABLET | Freq: Three times a day (TID) | ORAL | 0 refills | Status: DC | PRN

## 2016-03-23 MED ORDER — SULFAMETHOXAZOLE-TRIMETHOPRIM 800-160 MG PO TABS: 1.0000 | ORAL_TABLET | Freq: Two times a day (BID) | ORAL | 0 refills | Status: DC

## 2016-03-23 MED ORDER — TETANUS-DIPHTH-ACELL PERTUSSIS 5-2.5-18.5 LF-MCG/0.5 IM SUSP
0.5000 mL | Freq: Once | INTRAMUSCULAR | Status: AC
  Administered 2016-03-23: 0.5 mL via INTRAMUSCULAR
  Filled 2016-03-23: qty 0.5

## 2016-03-23 MED ORDER — IBUPROFEN 800 MG PO TABS
800.0000 mg | ORAL_TABLET | Freq: Once | ORAL | Status: AC
  Administered 2016-03-23: 800 mg via ORAL
  Filled 2016-03-23: qty 1

## 2016-03-23 NOTE — ED Triage Notes (Signed)
Pt states she hit arm on piece of metal decoration on car on monday. Pt has puncture wound on right wrist. Pt states she was able to pull all of metal piece out.

## 2016-03-23 NOTE — Discharge Instructions (Signed)
Read the information below.  Use the prescribed medication as directed.  Please discuss all new medications with your pharmacist.  Do not take additional tylenol while taking the prescribed pain medication to avoid overdose.  You may return to the Emergency Department at any time for worsening condition or any new symptoms that concern you.      If you develop redness, swelling, pus draining from the wound, or fevers greater than 100.4, return to the ER immediately for a recheck.  If you develop uncontrolled pain, weakness or numbness of the extremity, severe discoloration of the skin, or you are unable to move your fingers, return to the ER for a recheck.

## 2016-03-23 NOTE — ED Provider Notes (Signed)
[facility] Provider Note   CSN: [id number] Arrival date & time: 03/23/16  0750     History   Chief Complaint Chief Complaint  Patient presents with  . Arm Injury    HPI Alice Austin is a 30 y.o. female.  HPI   Patient presents with right dorsal forearm pain and swelling following puncture wound from metal decoration on a car that occurred 4 days ago.  States she was getting out of a car when she accidentally hit a metal flame design that was sticking off of the car.  She pulled her arm off of the metal, which did not break.  She has since had gradually increasing swelling and pain in this area that is now radiating into her right hand, right upper arm, and right leg.  She has had some blood and white/yellow pus coming out of it, but not in the past two days.  She has been cleaning with peroxide, placing neosporin and bandages on it.  Has some tingling and pain over the dorsal hand.  Denies fevers.    Past Medical History:  Diagnosis Date  . Hyperlipemia   . Incomplete abortion     Patient Active Problem List   Diagnosis Date Noted  . 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 Tendinitis of wrist 10/04/2013  . Bipolar affective disorder (HCC) 06/25/2013  . 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 H/O previous obstetrical problem 06/06/2013  . High-risk pregnancy 06/06/2013  . Adult BMI 30+ 06/06/2013    Past Surgical History:  Procedure Laterality Date  . CESAREAN SECTION     2015  . DILATION AND CURETTAGE OF UTERUS     2009  . DILATION AND EVACUATION N/A 04/25/2015   Procedure: DILATATION AND EVACUATION;  Surgeon: Herold Harms, MD;  Location: [facility] ORS;  Service: Gynecology;  Laterality: N/A;    OB History    Gravida Para Term Preterm AB Living   4 1 1   2 2    SAB TAB Ectopic Multiple Live Births   1 1     2        Home Medications    Prior to Admission medications   Medication Sig Start Date End Date Taking? Authorizing Provider  cyclobenzaprine  (FLEXERIL) 10 MG tablet Take 1 tablet (10 mg total) by mouth 2 (two) times daily as needed for muscle spasms. 02/25/16   Carolynn Comment, MD  docusate sodium (COLACE) 100 MG capsule Take 1 tablet once or twice daily as needed for constipation while taking narcotic pain medicine Patient not taking: Reported on 02/25/2016 06/22/15   Loleta Rose, MD  HYDROcodone-acetaminophen (NORCO/VICODIN) 5-325 MG tablet Take 1 tablet by mouth every 6 (six) hours as needed for severe pain. 03/23/16   Trixie Dredge, PA-C  ibuprofen (ADVIL,MOTRIN) 800 MG tablet Take 1 tablet (800 mg total) by mouth every 8 (eight) hours as needed for mild pain or moderate pain. 03/23/16   Trixie Dredge, PA-C  norethindrone-ethinyl estradiol-iron (ESTROSTEP FE,TILIA FE,TRI-LEGEST FE) 1-20/1-30/1-35 MG-MCG tablet Take 1 tablet by mouth daily.    Historical Provider, MD  ondansetron (ZOFRAN) 4 MG tablet Take 1-2 tabs by mouth every 8 hours as needed for nausea/vomiting Patient not taking: Reported on 02/25/2016 06/22/15   Loleta Rose, MD  sulfamethoxazole-trimethoprim (BACTRIM DS,SEPTRA DS) 800-160 MG tablet Take 1 tablet by mouth 2 (two) times daily. X 7 days 03/23/16   Trixie Dredge, PA-C    Family History Family History  Problem Relation Age of Onset  . Diabetes Father   .  Cancer Neg Hx   . Heart disease Neg Hx     Social History Social History  Substance Use Topics  . Smoking status: Former Games developermoker  . Smokeless tobacco: Never Used  . Alcohol use No     Comment: once a months     Allergies   Review of patient's allergies indicates no known allergies.   Review of Systems Review of Systems   Physical Exam Updated Vital Signs BP 138/98 (BP Location: Right Arm)   Pulse 66   Temp 98.6 F (37 C) (Oral)   Resp 16   LMP 03/02/2016 (Approximate)   SpO2 99%   Physical Exam  Constitutional: She appears well-developed and well-nourished. No distress.  HENT:  Head: Normocephalic and atraumatic.  Neck: Neck supple.  Pulmonary/Chest:  Effort normal.  Musculoskeletal:  Right dorsal forearm with small curved laceration vs puncture wound that is closed.  Surrounding edema and mild ecchymosis, tender to palpation.  No fluctuance or induration, no overlying erythema or warmth.  Moves all fingers evenly, distal sensation intact, capillary refill < 2 seconds.  Pain is worse with flexion of wrist and fingers, better with extension.   Neurological: She is alert.  Skin: She is not diaphoretic.  Nursing note and vitals reviewed.    ED Treatments / Results  Labs (all labs ordered are listed, but only abnormal results are displayed) Labs Reviewed - No data to display  EKG  EKG Interpretation None       Radiology Dg Forearm Right  Result Date: 03/23/2016 CLINICAL DATA:  Puncture wound of the distal right forearm by a metal object EXAM: RIGHT FOREARM - 2 VIEW COMPARISON:  Right wrist film of 09/27/2013 FINDINGS: No acute fracture is seen. Alignment is normal. No opaque foreign body is noted. The radiocarpal joint space is unremarkable. There does appear to be soft tissue swelling along the radial aspect of the distal right forearm. IMPRESSION: Soft tissue swelling.  No fracture.  No metallic foreign body. Electronically Signed   By: Dwyane DeePaul  Barry M.D.   On: 03/23/2016 08:42    Procedures Procedures (including critical care time)  Medications Ordered in ED Medications  Tdap (BOOSTRIX) injection 0.5 mL (0.5 mLs Intramuscular Given 03/23/16 0904)  ibuprofen (ADVIL,MOTRIN) tablet 800 mg (800 mg Oral Given 03/23/16 0905)     Initial Impression / Assessment and Plan / ED Course  I have reviewed the triage vital signs and the nursing notes.  Pertinent labs & imaging results that were available during my care of the patient were reviewed by me and considered in my medical decision making (see chart for details).  Clinical Course   Afebrile, nontoxic patient with puncture wound and swelling to right dorsal forearm. Bedside  ultrasound by me with no noted abnormal fluid collection.  Doubt abscess.  No e/o cellulitis clinically.  Given gradual increase in pain will treat with antibiotics, have advised warm moist compresses several times daily.  Neurovascularly intact.  Doubt tendon damage from injury.  Doubt tenosynovitis.  D/C home with bactrim, pain medication.  Pt does have small children.  I have advised her to only use pain medication if not the primary caregiver for the children, advised not to cosleep or drive a car while taking the medication.   Discussed result, findings, treatment, and follow up  with patient.  Pt given return precautions.  Pt verbalizes understanding and agrees with plan.       Final Clinical Impressions(s) / ED Diagnoses   Final diagnoses:  Injury  of right upper extremity, initial encounter  Puncture wound    New Prescriptions Discharge Medication List as of 03/23/2016  9:32 AM    START taking these medications   Details  sulfamethoxazole-trimethoprim (BACTRIM DS,SEPTRA DS) 800-160 MG tablet Take 1 tablet by mouth 2 (two) times daily. X 7 days, Starting Tue 03/23/2016, Print         [place], [place] 03/23/16 1205    Cathren Laine, MD 03/23/16 1504

## 2016-08-10 ENCOUNTER — Encounter (HOSPITAL_COMMUNITY): Payer: Self-pay | Admitting: Emergency Medicine

## 2016-08-10 ENCOUNTER — Emergency Department (HOSPITAL_COMMUNITY)
Admission: EM | Admit: 2016-08-10 | Discharge: 2016-08-10 | Disposition: A | Discharge status: Home / Self Care | Payer: Self-pay | Attending: Emergency Medicine | Admitting: Emergency Medicine

## 2016-08-10 DIAGNOSIS — Z79899 Other long term (current) drug therapy: Secondary | ICD-10-CM | POA: Insufficient documentation

## 2016-08-10 DIAGNOSIS — Z87891 Personal history of nicotine dependence: Secondary | ICD-10-CM | POA: Insufficient documentation

## 2016-08-10 DIAGNOSIS — R51 Headache: Secondary | ICD-10-CM | POA: Insufficient documentation

## 2016-08-10 DIAGNOSIS — R519 Headache, unspecified: Secondary | ICD-10-CM

## 2016-08-10 LAB — CBC WITH DIFFERENTIAL/PLATELET
BASOS ABS: 0.1 10*3/uL (ref 0.0–0.1)
Basophils Relative: 1 %
Eosinophils Absolute: 0.1 10*3/uL (ref 0.0–0.7)
Eosinophils Relative: 2 %
HCT: 35.2 % — ABNORMAL LOW (ref 36.0–46.0)
HEMOGLOBIN: 11.1 g/dL — AB (ref 12.0–15.0)
LYMPHS PCT: 38 %
Lymphs Abs: 3 10*3/uL (ref 0.7–4.0)
MCH: 25.3 pg — AB (ref 26.0–34.0)
MCHC: 31.5 g/dL (ref 30.0–36.0)
MCV: 80.2 fL (ref 78.0–100.0)
Monocytes Absolute: 0.8 10*3/uL (ref 0.1–1.0)
Monocytes Relative: 10 %
NEUTROS PCT: 49 %
Neutro Abs: 3.9 10*3/uL (ref 1.7–7.7)
Platelets: 279 10*3/uL (ref 150–400)
RBC: 4.39 MIL/uL (ref 3.87–5.11)
RDW: 14.9 % (ref 11.5–15.5)
WBC: 7.9 10*3/uL (ref 4.0–10.5)

## 2016-08-10 LAB — BASIC METABOLIC PANEL
ANION GAP: 4 — AB (ref 5–15)
BUN: 15 mg/dL (ref 6–20)
CHLORIDE: 106 mmol/L (ref 101–111)
CO2: 28 mmol/L (ref 22–32)
Calcium: 9.7 mg/dL (ref 8.9–10.3)
Creatinine, Ser: 0.62 mg/dL (ref 0.44–1.00)
GFR calc Af Amer: >60 mL/min (ref >60)
GFR calc non Af Amer: >60 mL/min (ref >60)
Glucose, Bld: 126 mg/dL — ABNORMAL HIGH (ref 65–99)
POTASSIUM: 4.8 mmol/L (ref 3.5–5.1)
SODIUM: 138 mmol/L (ref 135–145)

## 2016-08-10 LAB — I-STAT BETA HCG BLOOD, ED (MC, WL, AP ONLY): I-stat hCG, quantitative: <5 m[IU]/mL (ref <5)

## 2016-08-10 MED ORDER — KETOROLAC TROMETHAMINE 30 MG/ML IJ SOLN
15.0000 mg | Freq: Once | INTRAMUSCULAR | Status: AC
  Administered 2016-08-10: 15 mg via INTRAVENOUS
  Filled 2016-08-10: qty 1

## 2016-08-10 MED ORDER — SODIUM CHLORIDE 0.9 % IV BOLUS (SEPSIS)
1000.0000 mL | Freq: Once | INTRAVENOUS | Status: AC
  Administered 2016-08-10: 1000 mL via INTRAVENOUS

## 2016-08-10 MED ORDER — PROCHLORPERAZINE EDISYLATE 5 MG/ML IJ SOLN
10.0000 mg | Freq: Once | INTRAMUSCULAR | Status: AC
  Administered 2016-08-10: 10 mg via INTRAVENOUS
  Filled 2016-08-10: qty 2

## 2016-08-10 MED ORDER — DIPHENHYDRAMINE HCL 50 MG/ML IJ SOLN
25.0000 mg | Freq: Once | INTRAMUSCULAR | Status: AC
  Administered 2016-08-10: 25 mg via INTRAVENOUS
  Filled 2016-08-10: qty 1

## 2016-08-10 NOTE — ED Triage Notes (Signed)
Patient states that she has been having headaches for 3 weeks that have been intermittent.  Patient states that they are worse when she wakes up.  patient states that she also has worse headaches with menstrual cycles.

## 2016-08-10 NOTE — Discharge Instructions (Signed)
I recommend continue to take Tylenol and/or ibuprofen as prescribed over-the-counter as needed for additional pain relief. Continue taking fluids at home to remain hydrated. Follow-up with your primary care provider within the next week as needed if symptoms have not improved. Please return to the Emergency Department if symptoms worsen or new onset of fever, neck stiffness, visual changes, abdominal pain, vomiting, urinary symptoms, numbness, tingling, weakness, seizures, syncope.

## 2016-08-10 NOTE — ED Provider Notes (Signed)
[facility]-EMERGENCY DEPT Provider Note   CSN: [id number] Arrival date & time: 08/10/16  1707     History   Chief Complaint Chief Complaint  Patient presents with  . Headache    HPI Alice Austin is a 31 y.o. female.  HPI   Patient is a 31 year old female with history of hyperlipidemia who presents the ED with complaint of headache, onset 2-3 weeks. Patient reports she has had a gradually worsening waxing and waning headache for the past 2 weeks. Patient reports having throbbing aching pain to her forehead which radiates around to the top of her head. Endorses associated intermittent lightheadedness with movement, intermittent nausea. Patient reports having similar headaches in the past when she was pregnant. Reports taking Tylenol, ibuprofen and Aleve at home without relief. Patient reports headache is mildly worsened with activity, denies any alleviating factors. Pt denies fever, neck stiffness, visual changes, photophobia, abdominal pain, vomiting, urinary symptoms, numbness, tingling, weakness, seizures, syncope.  Denies any recent fall or head injury.  Past Medical History:  Diagnosis Date  . Hyperlipemia   . Incomplete abortion     Patient Active Problem List   Diagnosis Date Noted  . 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 Tendinitis of wrist 10/04/2013  . Bipolar affective disorder (HCC) 06/25/2013  . 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 H/O previous obstetrical problem 06/06/2013  . High-risk pregnancy 06/06/2013  . Adult BMI 30+ 06/06/2013    Past Surgical History:  Procedure Laterality Date  . CESAREAN SECTION     2015  . DILATION AND CURETTAGE OF UTERUS     2009  . DILATION AND EVACUATION N/A 04/25/2015   Procedure: DILATATION AND EVACUATION;  Surgeon: Herold HarmsMartin A Defrancesco, MD;  Location: [facility] ORS;  Service: Gynecology;  Laterality: N/A;    OB History    Gravida Para Term Preterm AB Living   4 1 1   2 2    SAB TAB Ectopic Multiple Live Births   1 1     2         Home Medications    Prior to Admission medications   Medication Sig Start Date End Date Taking? Authorizing Provider  naproxen sodium (ANAPROX) 220 MG tablet Take 220 mg by mouth 2 (two) times daily with a meal.   Yes Historical Provider, MD  cyclobenzaprine (FLEXERIL) 10 MG tablet Take 1 tablet (10 mg total) by mouth 2 (two) times daily as needed for muscle spasms. Patient not taking: Reported on 08/10/2016 02/25/16   Carolynn CommentBryan Strelow, MD  docusate sodium (COLACE) 100 MG capsule Take 1 tablet once or twice daily as needed for constipation while taking narcotic pain medicine Patient not taking: Reported on 02/25/2016 06/22/15   Loleta Roseory Forbach, MD  HYDROcodone-acetaminophen (NORCO/VICODIN) 5-325 MG tablet Take 1 tablet by mouth every 6 (six) hours as needed for severe pain. Patient not taking: Reported on 08/10/2016 03/23/16   Trixie DredgeEmily West, PA-C  ibuprofen (ADVIL,MOTRIN) 800 MG tablet Take 1 tablet (800 mg total) by mouth every 8 (eight) hours as needed for mild pain or moderate pain. Patient not taking: Reported on 08/10/2016 03/23/16   Trixie DredgeEmily West, PA-C  sulfamethoxazole-trimethoprim (BACTRIM DS,SEPTRA DS) 800-160 MG tablet Take 1 tablet by mouth 2 (two) times daily. X 7 days Patient not taking: Reported on 08/10/2016 03/23/16   Trixie DredgeEmily West, PA-C    Family History Family History  Problem Relation Age of Onset  . Diabetes Father   . Cancer Neg Hx   . Heart disease Neg Hx  Social History Social History  Substance Use Topics  . Smoking status: Former Games developer  . Smokeless tobacco: Never Used  . Alcohol use No     Comment: once a months     Allergies   Patient has no known allergies.   Review of Systems Review of Systems  Gastrointestinal: Positive for nausea.  Neurological: Positive for light-headedness and headaches.  All other systems reviewed and are negative.    Physical Exam Updated Vital Signs BP 126/73 (BP Location: Right Arm)   Pulse 74   Temp 97.7 F (36.5 C) (Oral)    Resp 18   SpO2 96%   Physical Exam  Constitutional: She is oriented to person, place, and time. She appears well-developed and well-nourished. No distress.  HENT:  Head: Normocephalic and atraumatic.  Right Ear: Tympanic membrane normal.  Left Ear: Tympanic membrane normal.  Nose: Nose normal.  Mouth/Throat: Uvula is midline, oropharynx is clear and moist and mucous membranes are normal. No oropharyngeal exudate, posterior oropharyngeal edema, posterior oropharyngeal erythema or tonsillar abscesses. No tonsillar exudate.  Eyes: Conjunctivae and EOM are normal. Pupils are equal, round, and reactive to light. Right eye exhibits no discharge. Left eye exhibits no discharge. No scleral icterus.  No nystagmus  Neck: Normal range of motion. Neck supple.  Cardiovascular: Normal rate, regular rhythm, normal heart sounds and intact distal pulses.   Pulmonary/Chest: Effort normal and breath sounds normal. No respiratory distress. She has no wheezes. She has no rales. She exhibits no tenderness.  Abdominal: Soft. Bowel sounds are normal. She exhibits no distension and no mass. There is no tenderness. There is no rebound and no guarding. No hernia.  Musculoskeletal: Normal range of motion. She exhibits no edema.  No midline C, T, or L tenderness. Full range of motion of neck and back. Full range of motion of bilateral upper and lower extremities, with 5/5 strength. Sensation intact. 2+ radial and PT pulses. Cap refill <2 seconds. Patient able to stand and ambulate without assistance.    Lymphadenopathy:    She has no cervical adenopathy.  Neurological: She is alert and oriented to person, place, and time. No cranial nerve deficit or sensory deficit. Coordination normal.  Skin: Skin is warm and dry. She is not diaphoretic.  Nursing note and vitals reviewed.    ED Treatments / Results  Labs (all labs ordered are listed, but only abnormal results are displayed) Labs Reviewed  CBC WITH  DIFFERENTIAL/PLATELET - Abnormal; Notable for the following:       Result Value   Hemoglobin 11.1 (*)    HCT 35.2 (*)    MCH 25.3 (*)    All other components within normal limits  BASIC METABOLIC PANEL - Abnormal; Notable for the following:    Glucose, Bld 126 (*)    Anion gap 4 (*)    All other components within normal limits  I-STAT BETA HCG BLOOD, ED ([facility], [facility], [facility] ONLY)    EKG  EKG Interpretation None       Radiology No results found.  Procedures Procedures (including critical care time)  Medications Ordered in ED Medications  sodium chloride 0.9 % bolus 1,000 mL (1,000 mLs Intravenous New Bag/Given 08/10/16 2103)  ketorolac (TORADOL) 30 MG/ML injection 15 mg (15 mg Intravenous Given 08/10/16 2104)  prochlorperazine (COMPAZINE) injection 10 mg (10 mg Intravenous Given 08/10/16 2105)  diphenhydrAMINE (BENADRYL) injection 25 mg (25 mg Intravenous Given 08/10/16 2103)     Initial Impression / Assessment and Plan / ED Course  I have reviewed the triage vital signs and the nursing notes.  Pertinent labs & imaging results that were available during my care of the patient were reviewed by me and considered in my medical decision making (see chart for details).     Patient presents with waxing and waning headaches she has had for the past few weeks with associated intermittent nausea and lightheadedness. Reports history of similar headaches in the past when she was pregnant. Denies fever. Exam unremarkable. No neuro deficits. Patient given IV fluids and migraine cocktail. Pregnancy negative. Labs unremarkable. Patient without focal neuro signs, onset of headache with exertion, or systemic symptoms. I do not suspect SAH, ICH, intracranial lesion, meningitis, encephalopathy or encephalitis and do not think that cranial imaging is needed at this time.  On reevaluation patient reports significant improvement of headache after migraine cocktail. She able to stand and ambulate without  assistance. Patient tolerating by mouth. Plan to discharge patient home with symptomatic treatment and PCP follow-up. Discussed return precautions.  Final Clinical Impressions(s) / ED Diagnoses   Final diagnoses:  Nonintractable headache, unspecified chronicity pattern, unspecified headache type    New Prescriptions New Prescriptions   No medications on file     Barrett Henle, [place] 08/10/16 2213    Lyndal Pulley, MD 08/12/16 [phone]

## 2016-08-10 NOTE — ED Notes (Signed)
Patient reports she is experiencing a left side, vertex headache intermittently, lasting 2-3 minutes everyday for the 2-3 weeks. Relates it to menstrual cycle. Also, reports dizziness, nausea, and noise bothers patient. Lights do not bother patient.

## 2016-11-27 ENCOUNTER — Encounter (HOSPITAL_COMMUNITY): Payer: Self-pay | Admitting: Emergency Medicine

## 2016-11-27 ENCOUNTER — Emergency Department (HOSPITAL_COMMUNITY): Payer: Self-pay

## 2016-11-27 ENCOUNTER — Observation Stay (HOSPITAL_COMMUNITY)
Admission: EM | Admit: 2016-11-27 | Discharge: 2016-11-28 | Disposition: A | Discharge status: Home / Self Care | Payer: Self-pay | Attending: Internal Medicine | Admitting: Internal Medicine

## 2016-11-27 DIAGNOSIS — R9431 Abnormal electrocardiogram [ECG] [EKG]: Secondary | ICD-10-CM | POA: Insufficient documentation

## 2016-11-27 DIAGNOSIS — R739 Hyperglycemia, unspecified: Secondary | ICD-10-CM | POA: Insufficient documentation

## 2016-11-27 DIAGNOSIS — E669 Obesity, unspecified: Secondary | ICD-10-CM

## 2016-11-27 DIAGNOSIS — D649 Anemia, unspecified: Secondary | ICD-10-CM

## 2016-11-27 DIAGNOSIS — R55 Syncope and collapse: Principal | ICD-10-CM | POA: Insufficient documentation

## 2016-11-27 DIAGNOSIS — E785 Hyperlipidemia, unspecified: Secondary | ICD-10-CM

## 2016-11-27 DIAGNOSIS — R000 Tachycardia, unspecified: Secondary | ICD-10-CM | POA: Insufficient documentation

## 2016-11-27 DIAGNOSIS — Z87891 Personal history of nicotine dependence: Secondary | ICD-10-CM | POA: Insufficient documentation

## 2016-11-27 DIAGNOSIS — R531 Weakness: Secondary | ICD-10-CM | POA: Insufficient documentation

## 2016-11-27 DIAGNOSIS — R0602 Shortness of breath: Secondary | ICD-10-CM | POA: Insufficient documentation

## 2016-11-27 DIAGNOSIS — F10920 Alcohol use, unspecified with intoxication, uncomplicated: Secondary | ICD-10-CM | POA: Insufficient documentation

## 2016-11-27 DIAGNOSIS — Z833 Family history of diabetes mellitus: Secondary | ICD-10-CM

## 2016-11-27 DIAGNOSIS — F10929 Alcohol use, unspecified with intoxication, unspecified: Secondary | ICD-10-CM | POA: Diagnosis present

## 2016-11-27 HISTORY — DX: Cerebral infarction, unspecified: I63.9

## 2016-11-27 HISTORY — DX: Depression, unspecified: F32.A

## 2016-11-27 HISTORY — DX: Major depressive disorder, single episode, unspecified: F32.9

## 2016-11-27 LAB — URINALYSIS, ROUTINE W REFLEX MICROSCOPIC
Bilirubin Urine: NEGATIVE
KETONES UR: NEGATIVE mg/dL
LEUKOCYTES UA: NEGATIVE
NITRITE: NEGATIVE
PH: 6 (ref 5.0–8.0)
Protein, ur: 30 mg/dL — AB
Specific Gravity, Urine: 1.005 (ref 1.005–1.030)

## 2016-11-27 LAB — BASIC METABOLIC PANEL
Anion gap: 11 (ref 5–15)
BUN: 10 mg/dL (ref 6–20)
CALCIUM: 8.8 mg/dL — AB (ref 8.9–10.3)
CO2: 21 mmol/L — ABNORMAL LOW (ref 22–32)
CREATININE: 0.56 mg/dL (ref 0.44–1.00)
Chloride: 108 mmol/L (ref 101–111)
GFR calc Af Amer: >60 mL/min (ref >60)
Glucose, Bld: 259 mg/dL — ABNORMAL HIGH (ref 65–99)
Potassium: 3.5 mmol/L (ref 3.5–5.1)
SODIUM: 140 mmol/L (ref 135–145)

## 2016-11-27 LAB — CBC
HCT: 35.8 % — ABNORMAL LOW (ref 36.0–46.0)
Hemoglobin: 11 g/dL — ABNORMAL LOW (ref 12.0–15.0)
MCH: 26 pg (ref 26.0–34.0)
MCHC: 30.7 g/dL (ref 30.0–36.0)
MCV: 84.6 fL (ref 78.0–100.0)
PLATELETS: 255 10*3/uL (ref 150–400)
RBC: 4.23 MIL/uL (ref 3.87–5.11)
RDW: 15.6 % — AB (ref 11.5–15.5)
WBC: 7.2 10*3/uL (ref 4.0–10.5)

## 2016-11-27 LAB — RAPID URINE DRUG SCREEN, HOSP PERFORMED
Amphetamines: NOT DETECTED
BENZODIAZEPINES: NOT DETECTED
Barbiturates: NOT DETECTED
COCAINE: NOT DETECTED
Opiates: NOT DETECTED
Tetrahydrocannabinol: NOT DETECTED

## 2016-11-27 LAB — RETICULOCYTES
RBC.: 4.26 MIL/uL (ref 3.87–5.11)
RETIC COUNT ABSOLUTE: 110.8 10*3/uL (ref 19.0–186.0)
RETIC CT PCT: 2.6 % (ref 0.4–3.1)

## 2016-11-27 LAB — ETHANOL: Alcohol, Ethyl (B): 153 mg/dL — ABNORMAL HIGH (ref <5)

## 2016-11-27 LAB — D-DIMER, QUANTITATIVE (NOT AT ARMC)

## 2016-11-27 LAB — CBG MONITORING, ED
GLUCOSE-CAPILLARY: 189 mg/dL — AB (ref 65–99)
Glucose-Capillary: 251 mg/dL — ABNORMAL HIGH (ref 65–99)

## 2016-11-27 LAB — FERRITIN: FERRITIN: 17 ng/mL (ref 11–307)

## 2016-11-27 LAB — POC URINE PREG, ED: Preg Test, Ur: NEGATIVE

## 2016-11-27 MED ORDER — SODIUM CHLORIDE 0.9 % IV BOLUS (SEPSIS)
1000.0000 mL | Freq: Once | INTRAVENOUS | Status: AC
  Administered 2016-11-27: 1000 mL via INTRAVENOUS

## 2016-11-27 MED ORDER — IBUPROFEN 400 MG PO TABS
400.0000 mg | ORAL_TABLET | Freq: Four times a day (QID) | ORAL | Status: DC | PRN
  Administered 2016-11-27 (×2): 400 mg via ORAL
  Filled 2016-11-27 (×2): qty 1

## 2016-11-27 MED ORDER — ACETAMINOPHEN 650 MG RE SUPP: 650.0000 mg | Freq: Four times a day (QID) | RECTAL | Status: DC | PRN

## 2016-11-27 MED ORDER — ACETAMINOPHEN 325 MG PO TABS: 650.0000 mg | ORAL_TABLET | Freq: Four times a day (QID) | ORAL | Status: DC | PRN

## 2016-11-27 MED ORDER — ENOXAPARIN SODIUM 40 MG/0.4ML ~~LOC~~ SOLN
40.0000 mg | SUBCUTANEOUS | Status: DC
Start: 2016-11-27 — End: 2016-11-28
  Administered 2016-11-27 – 2016-11-28 (×2): 40 mg via SUBCUTANEOUS
  Filled 2016-11-27 (×2): qty 0.4

## 2016-11-27 MED ORDER — SODIUM CHLORIDE 0.9% FLUSH
3.0000 mL | Freq: Two times a day (BID) | INTRAVENOUS | Status: DC
  Administered 2016-11-28: 3 mL via INTRAVENOUS

## 2016-11-27 MED ORDER — SODIUM CHLORIDE 0.9 % IV SOLN
INTRAVENOUS | Status: AC
  Administered 2016-11-27: 14:00:00 via INTRAVENOUS

## 2016-11-27 MED ORDER — ORAL CARE MOUTH RINSE
15.0000 mL | Freq: Two times a day (BID) | OROMUCOSAL | Status: DC
  Administered 2016-11-28: 15 mL via OROMUCOSAL
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
## 2016-11-27 NOTE — ED Notes (Signed)
Pt unable to stand without two staff to assist her, states she is too weak to ambulate.

## 2016-11-27 NOTE — ED Notes (Signed)
Attempted report 

## 2016-11-27 NOTE — ED Notes (Signed)
Delay in lab draw edp examining pt. 

## 2016-11-27 NOTE — ED Notes (Signed)
Nurse drawing labs. 
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
## 2016-11-27 NOTE — ED Provider Notes (Signed)
[facility]EMERGENCY DEPT Provider Note   CSN: [id number] Arrival date & time: 11/27/16  0347     History   Chief Complaint Chief Complaint  Patient presents with  . Loss of Consciousness    HPI Alice Austin is a 31 y.o. female.  Level V caveat for intoxication. Patient presents via EMS after syncopal episode. She reports she was in a verbal altercation with her sister's boyfriend while sitting. She remembers feeling dizzy and lightheaded and next thing she notes she was on the ground. EMS report she had another syncopal episode witnessed this in their presence. Patient states she's been generally weak and short of breath throughout the day today. CBG was 333. Patient reports history of "borderline diabetes". She does not take any medications. She denies possibility of pregnancy. She admits to drinking 3 beers tonight. Denies any other drug use. Denies chest pain. Denies cough or fever. Denies any head injury. No neck or back pain. No focal weakness, numbness or tingling. No bowel bladder incontinence. No tongue biting.   The history is provided by the patient and the EMS personnel. The history is limited by the condition of the patient.  Loss of Consciousness   Associated symptoms include dizziness and light-headedness. Pertinent negatives include abdominal pain, back pain, chest pain, congestion, fever, nausea and vomiting.    Past Medical History:  Diagnosis Date  . Hyperlipemia   . Incomplete abortion     Patient Active Problem List   Diagnosis Date Noted  . 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 Tendinitis of wrist 10/04/2013  . Bipolar affective disorder (HCC) 06/25/2013  . 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 H/O previous obstetrical problem 06/06/2013  . High-risk pregnancy 06/06/2013  . Adult BMI 30+ 06/06/2013    Past Surgical History:  Procedure Laterality Date  . CESAREAN SECTION     2015  . DILATION AND CURETTAGE OF UTERUS     2009  . DILATION AND EVACUATION N/A  04/25/2015   Procedure: DILATATION AND EVACUATION;  Surgeon: Herold Harms, MD;  Location: [facility] ORS;  Service: Gynecology;  Laterality: N/A;    OB History    Gravida Para Term Preterm AB Living   4 1 1   2 2    SAB TAB Ectopic Multiple Live Births   1 1     2        Home Medications    Prior to Admission medications   Medication Sig Start Date End Date Taking? Authorizing Provider  cyclobenzaprine (FLEXERIL) 10 MG tablet Take 1 tablet (10 mg total) by mouth 2 (two) times daily as needed for muscle spasms. Patient not taking: Reported on 08/10/2016 02/25/16   Carolynn Comment, MD  docusate sodium (COLACE) 100 MG capsule Take 1 tablet once or twice daily as needed for constipation while taking narcotic pain medicine Patient not taking: Reported on 02/25/2016 06/22/15   Loleta Rose, MD  HYDROcodone-acetaminophen (NORCO/VICODIN) 5-325 MG tablet Take 1 tablet by mouth every 6 (six) hours as needed for severe pain. Patient not taking: Reported on 08/10/2016 03/23/16   Trixie Dredge, PA-C  ibuprofen (ADVIL,MOTRIN) 800 MG tablet Take 1 tablet (800 mg total) by mouth every 8 (eight) hours as needed for mild pain or moderate pain. Patient not taking: Reported on 08/10/2016 03/23/16   Trixie Dredge, PA-C  naproxen sodium (ANAPROX) 220 MG tablet Take 220 mg by mouth 2 (two) times daily with a meal.    [provider]  sulfamethoxazole-trimethoprim (BACTRIM DS,SEPTRA DS) 800-160 MG tablet Take 1  tablet by mouth 2 (two) times daily. X 7 days Patient not taking: Reported on 08/10/2016 03/23/16   Trixie DredgeWest, Emily, PA-C    Family History Family History  Problem Relation Age of Onset  . Diabetes Father   . Cancer Neg Hx   . Heart disease Neg Hx     Social History Social History  Substance Use Topics  . Smoking status: Former Games developermoker  . Smokeless tobacco: Never Used  . Alcohol use No     Comment: once a months     Allergies   Patient has no known allergies.   Review of Systems Review of  Systems  Constitutional: Negative for activity change, appetite change and fever.  HENT: Negative for congestion and rhinorrhea.   Eyes: Negative for visual disturbance.  Respiratory: Positive for shortness of breath.   Cardiovascular: Positive for syncope. Negative for chest pain.  Gastrointestinal: Negative for abdominal pain, nausea and vomiting.  Genitourinary: Negative for dysuria, hematuria, vaginal bleeding and vaginal discharge.  Musculoskeletal: Negative for arthralgias, back pain, myalgias and neck pain.  Neurological: Positive for dizziness, syncope and light-headedness.    all other systems are negative except as noted in the HPI and PMH.    Physical Exam Updated Vital Signs BP 134/77 (BP Location: Right Arm)   Pulse (!) 106   Temp 98 F (36.7 C) (Oral)   Resp 12   Ht 5' (1.524 m)   Wt 108.9 kg (240 lb)   SpO2 (!) 87%   BMI 46.87 kg/m   Physical Exam  Constitutional: She is oriented to person, place, and time. She appears well-developed and well-nourished. No distress.  Obese, intoxicated  HENT:  Head: Normocephalic and atraumatic.  Mouth/Throat: Oropharynx is clear and moist. No oropharyngeal exudate.  Eyes: Conjunctivae and EOM are normal. Pupils are equal, round, and reactive to light.  Neck: Normal range of motion. Neck supple.  No C spine tenderness.  Cardiovascular: Normal rate, normal heart sounds and intact distal pulses.  Exam reveals no gallop.   No murmur heard. Tachycardic 110s  Pulmonary/Chest: Effort normal and breath sounds normal. No respiratory distress. She exhibits no tenderness.  Abdominal: Soft. There is no tenderness. There is no rebound and no guarding.  Musculoskeletal: Normal range of motion. She exhibits no edema or tenderness.  Neurological: She is alert and oriented to person, place, and time. No cranial nerve deficit. She exhibits normal muscle tone. Coordination normal.   5/5 strength throughout. CN 2-12 intact.Equal grip strength.    Skin: Skin is warm.  Psychiatric: She has a normal mood and affect. Her behavior is normal.  Nursing note and vitals reviewed.    ED Treatments / Results  Labs (all labs ordered are listed, but only abnormal results are displayed) Labs Reviewed  BASIC METABOLIC PANEL - Abnormal; Notable for the following:       Result Value   CO2 21 (*)    Glucose, Bld 259 (*)    Calcium 8.8 (*)    All other components within normal limits  CBC - Abnormal; Notable for the following:    Hemoglobin 11.0 (*)    HCT 35.8 (*)    RDW 15.6 (*)    All other components within normal limits  URINALYSIS, ROUTINE W REFLEX MICROSCOPIC - Abnormal; Notable for the following:    Color, Urine STRAW (*)    Glucose, UA >=500 (*)    Hgb urine dipstick LARGE (*)    Protein, ur 30 (*)    Bacteria, UA  RARE (*)    Squamous Epithelial / LPF 0-5 (*)    All other components within normal limits  ETHANOL - Abnormal; Notable for the following:    Alcohol, Ethyl (B) 153 (*)    All other components within normal limits  CBG MONITORING, ED - Abnormal; Notable for the following:    Glucose-Capillary 251 (*)    All other components within normal limits  D-DIMER, QUANTITATIVE (NOT AT [facility])  RAPID URINE DRUG SCREEN, HOSP PERFORMED  POC URINE PREG, ED    EKG  EKG Interpretation  Date/Time:  Saturday November 27 2016 04:15:09 EDT Ventricular Rate:  110 PR Interval:    QRS Duration: 95 QT Interval:  368 QTC Calculation: 498 R Axis:   88 Text Interpretation:  Sinus tachycardia Abnormal T, consider ischemia, lateral leads Prolonged QT interval Rate faster Confirmed by Glynn Octave [phone]) on 11/27/2016 4:30:05 AM       Radiology Dg Chest 2 View  Result Date: 11/27/2016 CLINICAL DATA:  Initial evaluation for hypoxia, syncope. EXAM: CHEST  2 VIEW COMPARISON:  Prior radiograph from 02/25/2016. FINDINGS: Cardiac and mediastinal silhouettes are stable in size and contour, and remain within normal limits. Lungs are  hypoinflated. Mild scattered peribronchial thickening, which may reflect acute bronchiolitis and/or reactive airways disease. No consolidative opacity to suggest pneumonia. No pulmonary edema or pleural effusion. No pneumothorax. No acute osseus abnormality. IMPRESSION: 1. Mild scattered peribronchial thickening, which may reflect sequelae of acute bronchiolitis and/or reactive airways disease. No focal infiltrates to suggest pneumonia. 2. No other active cardiopulmonary disease. . Electronically Signed   By: Rise Mu M.D.   On: 11/27/2016 05:13   Ct Head Wo Contrast  Result Date: 11/27/2016 CLINICAL DATA:  Syncope.  Hypoxia. EXAM: CT HEAD WITHOUT CONTRAST TECHNIQUE: Contiguous axial images were obtained from the base of the skull through the vertex without intravenous contrast. COMPARISON:  None. FINDINGS: Brain: No evidence of acute infarction, hemorrhage, hydrocephalus, extra-axial collection or mass lesion/mass effect. Vascular: No hyperdense vessel or unexpected calcification. Skull: Normal. Negative for fracture or focal lesion. Sinuses/Orbits: Paranasal sinuses and mastoid air cells are clear. The visualized orbits are unremarkable. Other: Sebaceous cyst of the scalp vertex. IMPRESSION: No acute intracranial abnormality. Electronically Signed   By: Rubye Oaks M.D.   On: 11/27/2016 05:06    Procedures Procedures (including critical care time)  Medications Ordered in ED Medications  sodium chloride 0.9 % bolus 1,000 mL (1,000 mLs Intravenous New Bag/Given 11/27/16 0422)     Initial Impression / Assessment and Plan / ED Course  I have reviewed the triage vital signs and the nursing notes.  Pertinent labs & imaging results that were available during my care of the patient were reviewed by me and considered in my medical decision making (see chart for details).    Patient presents with syncopal episode 2. Preceded by generalized weakness and shortness of breath. She is found  to be mildly tachycardic and hypoxic on room air. Lungs are clear.  EKG shows sinus tachycardia borderline QT. No Brugada. Bedside ultrasound is limited but there is no evidence of right heart strain.  Labs show hyperglycemia without DKA.  Alcohol intoxication. CT head negative.   D-dimer negative. Doubt PE. HCG negative.   Orthostatics are positive, heart rate is 112 at rest and elevates to 120 with standing. Patient was unable to stand without assistance and cannot walk. EKG shows continued sinus tachycardia with borderline QT interval. She denies any chest pain or shortness of breath. Oxygen  is removed and she is maintaining her O2 saturations.  Suspect syncope in setting of alcohol intoxication, and likely dehydration from hyperglycemia. Likely has undiagnosed DM. Still with tachycardia, generalized weakness, difficulty walking. Admission d/w IM residents.     EMERGENCY DEPARTMENT [place] CARDIAC EXAM "Study: Limited Ultrasound of the Heart and Pericardium"  INDICATIONS:Abnormal vital signs and Dyspnea Multiple views of the heart and pericardium were obtained in real-time with a multi-frequency probe.  PERFORMED ZO:XWRUEA IMAGES ARCHIVED?: Yes LIMITATIONS:  Body habitus and Emergent procedure VIEWS USED: Apical 4 chamber  INTERPRETATION: Cardiac activity present, Pericardial effusion absent and Cardiac tamponade absent No R heart strain    Final Clinical Impressions(s) / ED Diagnoses   Final diagnoses:  Syncope and collapse  Prolonged Q-T interval on ECG  Alcoholic intoxication without complication [facility])    New Prescriptions New Prescriptions   No medications on file     Glynn Octave, MD 11/27/16 2048

## 2016-11-27 NOTE — Progress Notes (Signed)
Admission note:  Arrival Method: Patient arrived on stretcher from ED. Mental Orientation:  Alert and oriented x 4, but very sleepy. Telemetry: 6E-18, NSR. Assessment: See doc flow sheets. Skin: Warm, dry and intact. IV: Left forearm currently infusing Bolus. Pain: Denies any pain. Tubes: N/A Safety Measures: bed in low position, call bell and phone within reach. Fall Prevention Safety Plan: Reviewed the plan, understood and acknowledged. Admission Screening: In progress. 6700 Orientation: Patient has been oriented to the unit, staff and to the room.

## 2016-11-27 NOTE — ED Notes (Signed)
Admitting MDs at bedside.

## 2016-11-28 DIAGNOSIS — Z6841 Body Mass Index (BMI) 40.0 and over, adult: Secondary | ICD-10-CM

## 2016-11-28 DIAGNOSIS — E119 Type 2 diabetes mellitus without complications: Secondary | ICD-10-CM

## 2016-11-28 LAB — COMPREHENSIVE METABOLIC PANEL
ALT: 134 U/L — ABNORMAL HIGH (ref 14–54)
ANION GAP: 5 (ref 5–15)
AST: 85 U/L — AB (ref 15–41)
Albumin: 3.2 g/dL — ABNORMAL LOW (ref 3.5–5.0)
Alkaline Phosphatase: 59 U/L (ref 38–126)
BILIRUBIN TOTAL: 0.6 mg/dL (ref 0.3–1.2)
BUN: 7 mg/dL (ref 6–20)
CHLORIDE: 106 mmol/L (ref 101–111)
CO2: 27 mmol/L (ref 22–32)
Calcium: 8.4 mg/dL — ABNORMAL LOW (ref 8.9–10.3)
Creatinine, Ser: 0.59 mg/dL (ref 0.44–1.00)
GFR calc Af Amer: >60 mL/min (ref >60)
Glucose, Bld: 128 mg/dL — ABNORMAL HIGH (ref 65–99)
POTASSIUM: 3.6 mmol/L (ref 3.5–5.1)
Sodium: 138 mmol/L (ref 135–145)
TOTAL PROTEIN: 6.4 g/dL — AB (ref 6.5–8.1)

## 2016-11-28 LAB — CBC
HCT: 34.9 % — ABNORMAL LOW (ref 36.0–46.0)
HEMOGLOBIN: 10.5 g/dL — AB (ref 12.0–15.0)
MCH: 25.7 pg — ABNORMAL LOW (ref 26.0–34.0)
MCHC: 30.1 g/dL (ref 30.0–36.0)
MCV: 85.3 fL (ref 78.0–100.0)
Platelets: 232 10*3/uL (ref 150–400)
RBC: 4.09 MIL/uL (ref 3.87–5.11)
RDW: 15.3 % (ref 11.5–15.5)
WBC: 6.6 10*3/uL (ref 4.0–10.5)

## 2016-11-28 LAB — HEMOGLOBIN A1C
HEMOGLOBIN A1C: 6.8 % — AB (ref 4.8–5.6)
MEAN PLASMA GLUCOSE: 148 mg/dL

## 2016-11-28 LAB — GLUCOSE, CAPILLARY: GLUCOSE-CAPILLARY: 136 mg/dL — AB (ref 65–99)

## 2016-11-28 LAB — HIV ANTIBODY (ROUTINE TESTING W REFLEX): HIV SCREEN 4TH GENERATION: NONREACTIVE

## 2016-11-28 MED ORDER — METFORMIN HCL 500 MG PO TABS: 500.0000 mg | ORAL_TABLET | Freq: Every day | ORAL | 0 refills | Status: AC

## 2016-11-28 MED ORDER — GI COCKTAIL ~~LOC~~
30.0000 mL | Freq: Once | ORAL | Status: AC
  Administered 2016-11-28: 30 mL via ORAL
  Filled 2016-11-28: qty 30

## 2016-11-28 NOTE — Progress Notes (Signed)
Patient c/o right upper sided chest pain with inspiratory and expiratory exhalations.  She rates as 6/10.  Also, c/o of non productive cough.  MD called and made aware.  Per MD order, gave ibuprofen at 2139.  Upon reassessment, patient has no complaint of pain.  Will continue to monitor.  Bernie CoveyKimberly Ciara Kagan RN-BC, [employer]

## 2016-11-28 NOTE — Progress Notes (Signed)
Patient Discharge: Disposition: Patient discharged to home. Education: Reviewed medications, prescriptions, follow-up appointments and discharge instructions, understood and acknowledged. IV: Discontinued IV before discharge. Telemetry: Discontinued Tele, CCMD notified. Transportation: Patient escorted out of the unit in w/c. Belongings: Patient took all her belonging with her.

## 2016-11-28 NOTE — Evaluation (Signed)
Physical Therapy Evaluation Patient Details Name: Alice Austin MRN: [id number] DOB: 01/05/1986 Today's Date: 11/28/2016   History of Present Illness  The patient is an obese 31 year old female with a past medical history of hyperlipidemia who presents after an episode of syncope in the setting of alcohol intoxication and acute stress (was having an argument); dehydration as well  Clinical Impression   Pt admitted with above diagnosis. Pt currently with functional limitations due to the deficits listed below (see PT Problem List). Alice Austin presents with balance deficits today, tending to reach out for UE support for balance with amb; using a cane helped her reasonably well -- given she is young, she is hesitant to use one; we discussed the possibility of today's balance problems as a carryover from acute intoxication yesterday, and that she will get better, and perhaps not need one; We also discussed the effects of alcohol on balance, and the effects of out-of-control blood sugar on sensation in hands and feet, and that therefore  effecting balance as well;   Noted the discharge summary, and of course dc home is indicated; will follow while she is in-hospital;  I encouraged her to keep regular appointments with her primary care MD for tight control of her blood sugar;  Worth considering Outpt PT for gait and balance if this acute episode doesn't clear; Alice Austin also told me about heel pain bilaterally -- perhaps heel spur or plantar fascitis? Recommend she get that checked out as Outpatient;  Pt will benefit from skilled PT to increase their independence and safety with mobility to allow discharge to the venue listed below.       Follow Up Recommendations Outpatient PT;Supervision - Intermittent; for bil heel pain, and potentially for a closer look at her balance    Equipment Recommendations  Cane    Recommendations for Other Services       Precautions / Restrictions  Precautions Precautions: Fall      Mobility  Bed Mobility Overal bed mobility: Independent                Transfers Overall transfer level: Modified independent               General transfer comment: Noted use of hands/UE support to stabilize  Ambulation/Gait Ambulation/Gait assistance: Min assist Ambulation Distance (Feet): 120 Feet Assistive device: None;Straight cane Gait Pattern/deviations: Decreased step length - right;Decreased step length - left;Staggering left;Staggering right Gait velocity: slowed Gait velocity interpretation: Below normal speed for age/gender General Gait Details: Noting tendency to reach out for UE support for balance; noted a few losses of balance throughout walk, tending to use stepping strategy and occasionally needing min assist to steady; walked back with cane, and this helped reasonably well; she would rather not use cane, but will use one if necessary  Stairs Stairs: Yes Stairs assistance: Min assist;Mod assist Stair Management: No rails;Forwards;Step to pattern Number of Stairs: 12 General stair comments: support given by this therapist; pt tells me her boyfriend will help her up and down the steps; occasional mod assist for balance  Wheelchair Mobility    Modified Rankin (Stroke Patients Only)       Balance                                             Pertinent Vitals/Pain Pain Assessment: 0-10 Pain Score: 5  Pain Location: bil heels;  not acute pain, has been happening for a while Pain Descriptors / Indicators: Aching;Dull Pain Intervention(s): Monitored during session    Home Living Family/patient expects to be discharged to:: Private residence Living Arrangements: Children;Other (Comment) (Mother) Available Help at Discharge: Family;Friend(s);Available PRN/intermittently Type of Home: House Home Access: Stairs to enter Entrance Stairs-Rails: None Entrance Stairs-Number of Steps: 12 Home  Layout: One level        Prior Function Level of Independence: Independent         Comments: 3 children, 13,11, 3     Hand Dominance        Extremity/Trunk Assessment   Upper Extremity Assessment Upper Extremity Assessment: Overall WFL for tasks assessed    Lower Extremity Assessment Lower Extremity Assessment: RLE deficits/detail;LLE deficits/detail RLE Deficits / Details: Dull pain bil heels; typically worst initially in the morning; perhaps heel spur or plantar fascitis in etiology       Communication   Communication: No difficulties  Cognition Arousal/Alertness: Awake/alert Behavior During Therapy: WFL for tasks assessed/performed Overall Cognitive Status: Within Functional Limits for tasks assessed                                        General Comments      Exercises     Assessment/Plan    PT Assessment Patient needs continued PT services  PT Problem List Decreased activity tolerance;Decreased balance;Decreased mobility;Decreased coordination;Decreased knowledge of use of DME;Decreased safety awareness;Decreased knowledge of precautions;Pain       PT Treatment Interventions DME instruction;Gait training;Stair training;Functional mobility training;Therapeutic activities;Therapeutic exercise;Balance training;Neuromuscular re-education;Patient/family education    PT Goals (Current goals can be found in the Care Plan section)  Acute Rehab PT Goals Patient Stated Goal: Wants to get home; would rather not use a cane PT Goal Formulation: With patient Time For Goal Achievement: 12/05/16 Potential to Achieve Goals: Good    Frequency Min 3X/week   Barriers to discharge        Co-evaluation               AM-PAC PT "6 Clicks" Daily Activity  Outcome Measure Difficulty turning over in bed (including adjusting bedclothes, sheets and blankets)?: None Difficulty moving from lying on back to sitting on the side of the bed? :  None Difficulty sitting down on and standing up from a chair with arms (e.g., wheelchair, bedside commode, etc,.)?: A Little Help needed moving to and from a bed to chair (including a wheelchair)?: A Little Help needed walking in hospital room?: A Little Help needed climbing 3-5 steps with a railing? : A Lot 6 Click Score: 19    End of Session Equipment Utilized During Treatment: Gait belt Activity Tolerance: Patient tolerated treatment well Patient left: in bed;with call bell/phone within reach Nurse Communication: Mobility status PT Visit Diagnosis: Unsteadiness on feet (R26.81)    Time: 0272-5366 PT Time Calculation (min) (ACUTE ONLY): 23 min   Charges:   PT Evaluation $PT Eval Low Complexity: 1 Procedure PT Treatments $Gait Training: 8-22 mins   PT G Codes:   PT G-Codes **NOT FOR INPATIENT CLASS** Functional Assessment Tool Used: Clinical judgement Functional Limitation: Mobility: Walking and moving around Mobility: Walking and Moving Around Current Status (Y4034): At least 1 percent but less than 20 percent impaired, limited or restricted Mobility: Walking and Moving Around Goal Status [phone]): 0 percent impaired, limited or restricted    Van Clines, PT  Acute Rehabilitation Services Pager [phone] Office [phone]   Levi Aland 11/28/2016, 11:41 AM

## 2016-11-28 NOTE — Progress Notes (Signed)
   Subjective: No acute events overnight. Patient feels well this morning. She has no complaints. She will be ready for discharge.  Objective:  Vital signs in last 24 hours: Vitals:   11/27/16 1803 11/27/16 2131 11/28/16 0453 11/28/16 0918  BP: 112/67 118/78 (!) 112/57 110/60  Pulse: 86 84 66 84  Resp: 18 18 18 18   Temp: 97.6 F (36.4 C) 98 F (36.7 C) 98.9 F (37.2 C) 97.7 F (36.5 C)  TempSrc: Oral Oral Oral Oral  SpO2: 99% 99% 96% 96%  Weight:  246 lb 7.6 oz (111.8 kg)    Height:       Physical Exam  Constitutional: She is oriented to person, place, and time. She appears well-developed and well-nourished.  HENT:  Head: Normocephalic and atraumatic.  Cardiovascular: Normal rate and regular rhythm.   Respiratory: Effort normal and breath sounds normal.  GI: Soft. Bowel sounds are normal. She exhibits no distension.  Musculoskeletal: She exhibits no edema.  Neurological: She is alert and oriented to person, place, and time.     Assessment/Plan:  Principal Problem:   Acute alcoholic intoxication (HCC) Active Problems:   Hyperglycemia   Normocytic anemia  # Syncope Patient feeling well this morning. I suspect her syncope was secondary to alcohol intoxication and dehydration. She improved with IV fluids. She'll be discharged today.  # Hyperglycemia ## DM Patient's hemoglobin A1c resulted at 6.8. She was thus diagnosed with diabetes. We will start her on metformin 500 mg once daily and have her follow-up in clinic. -- 500 mg daily metformin  # Anemia Most consistent with iron deficiency anemia from menstrual bleeding. Would recommend iron supplementation as needed.  Dispo: Anticipated discharge today.  Thomasene Lotaylor, Pansey Pinheiro, MD 11/28/2016, 10:34 AM Pager: [phone]

## 2016-11-28 NOTE — Discharge Summary (Signed)
Name: Alice RiderJessica M Austin MRN: [id number] DOB: 01/29/1986 31 y.o. PCP: [facility]  Date of Admission: 11/27/2016  3:48 AM Date of Discharge: 11/28/2016 Attending Physician: Earl LagosNarendra, Nischal, MD  Discharge Diagnosis: 1. Presyncope and intoxication 2. Diabetes 3. Suspected sleep apnea Principal Problem:   Acute alcoholic intoxication (HCC) Active Problems:   Hyperglycemia   Normocytic anemia   Discharge Medications: Allergies as of 11/28/2016   No Known Allergies     Medication List    STOP taking these medications   naproxen sodium 220 MG tablet Commonly known as:  ANAPROX     TAKE these medications   metFORMIN 500 MG tablet Commonly known as:  GLUCOPHAGE Take 1 tablet (500 mg total) by mouth daily.       Disposition and follow-up:   Ms.Alice Austin [facility] Good [facility].  At the hospital follow up visit please address:  1.  Please ensure the patient is taking her metformin.  2.  Labs / imaging needed at time of follow-up: None  3.  Pending labs/ test needing follow-up: HIV, sleep study  Follow-up Appointments: 1. Patient will have a appointment in [facility] medicine [facility] clinic.  Hospital Course by problem list: Principal Problem:   Acute alcoholic intoxication (HCC) Active Problems:   Hyperglycemia   Normocytic anemia   1. Presyncope and alcohol intoxication The patient [facility] to [facility] on 11/27/2016 with an episode of presyncope. This occurred in the setting of an alcohol binge and an argument she had at home. The patient states she was in the middle of an argument and felt lightheaded and almost blacked out. EMS was called which brought her to the hospital. Laboratory evaluation showed high ethanol level but was otherwise unremarkable. She was treated with aggressive IV hydration with improvement of her symptoms. There is no  history concerning for seizure. CT head without evidence of stroke. EKG normal and no history of palpitations or chest pain. I think the most likely etiology of the patient's presyncope was secondary to dehydration and alcohol intoxication. She improved with IV hydration. She was appropriate for discharge.  2. Diabetes mellitus newly diagnosed Patient hyperglycemic on admission. She endorsed several month history of polyuria and polydipsia. Hemoglobin A1c obtained during her hospitalization resulted at 6.8%. This was discussed with the patient and is diagnostic of diabetes. She was started on metformin 500 mg once daily. Please titrate this up as necessary in the outpatient clinic.  3. Possible sleep apnea The patient is extremely obese. She had episodes of minor desaturations overnight. Physical examination shows body habitus consistent in a patient with underlying sleep apnea. She would benefit from referral for out patient sleep study.  Discharge Vitals:   BP 110/60 (BP Location: Left Arm)   Pulse 84   Temp 97.7 F (36.5 C) (Oral)   Resp 18   Ht 5' (1.524 m)   Wt 246 lb 7.6 oz (111.8 kg)   SpO2 96%   BMI 48.14 kg/m   Pertinent Labs, Studies, and Procedures:  1. CT head-no acute intracranial normality 2. Hemoglobin A1c-6.8%  Discharge Instructions: Discharge Instructions    Diet - low sodium heart healthy    Complete by:  As directed    Discharge instructions    Complete by:  As directed    I think your episode of lightheadedness was caused by dehydration.  Your hemoglobin A1c was elevated to 6.7%. As discussed, this  is diagnostic of diabetes. We have started you on a medication called metformin. You're starting dose will be 500 mg and you're to take this once daily.  The doctors in clinic will most likely increase this medication over the next several weeks. Please follow up at your appointment. We will have the [facility] internal medicine teaching clinic contact you this week  to arrange a follow-up visit. Please ensure you make this appointment.  As discussed you will also need a sleep study. This will be arranged from your primary care doctors as well.  Please continue to stay well hydrated and continue to work on diet, exercise and weight loss.  It was a pleasure taking care of you. We hope he you continue to feel better.   Increase activity slowly    Complete by:  As directed       Signed: Thomasene Lot, MD 11/28/2016, 10:42 AM   Pager: [phone]

## 2017-06-24 IMAGING — US US OB COMP LESS 14 WK
1 series · 14 of 28 positions shown · non-contrast
Comparison: None.

CLINICAL DATA: Lower abdominal pain of 8 hours duration. Vaginal
bleeding.

EXAM:
OBSTETRIC <14 WK US AND TRANSVAGINAL OB US
TECHNIQUE: Both transabdominal and transvaginal ultrasound examinations were
performed for complete evaluation of the gestation as well as the
maternal uterus, adnexal regions, and pelvic cul-de-sac.
Transvaginal technique was performed to assess early pregnancy.

[Series 1: us ob comp less 14 wk · 0.21mm/px · 14 of 208 slices shown]
[im 8/208]
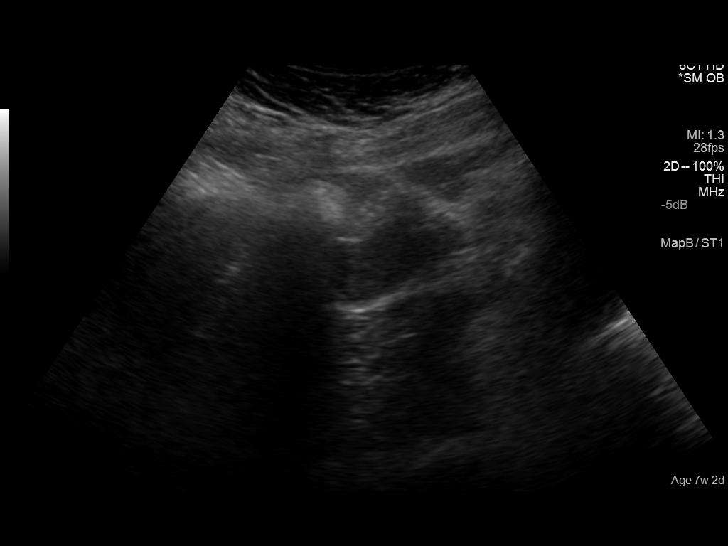
[im 24/208]
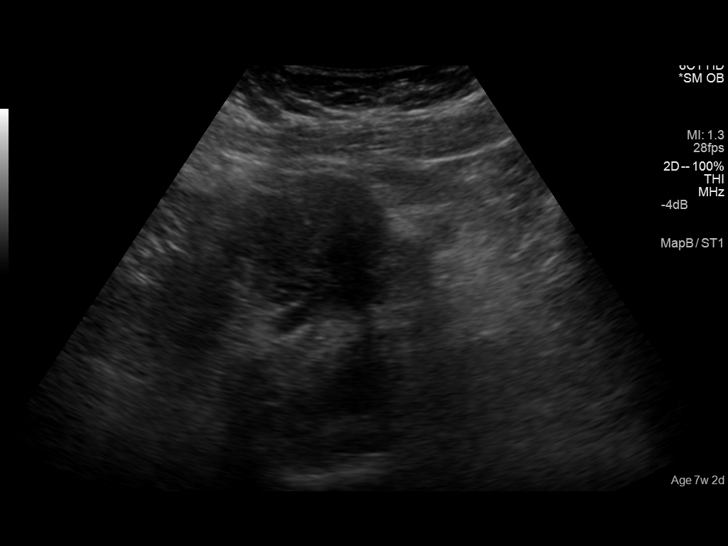
[im 39/208]
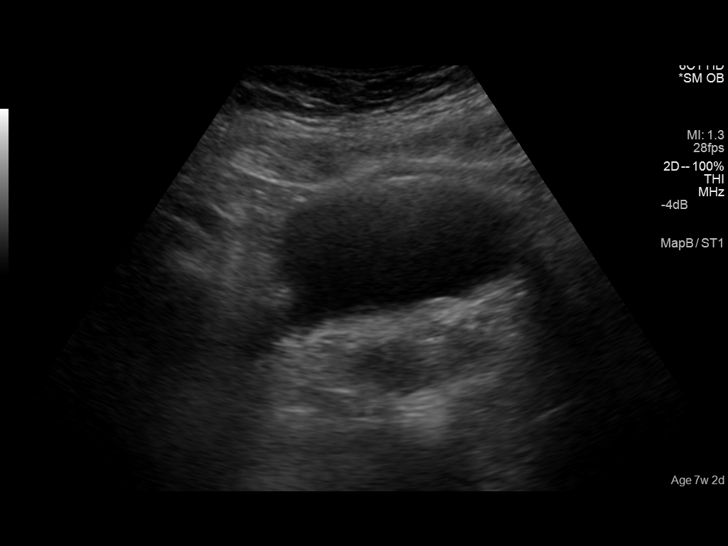
[im 54/208]
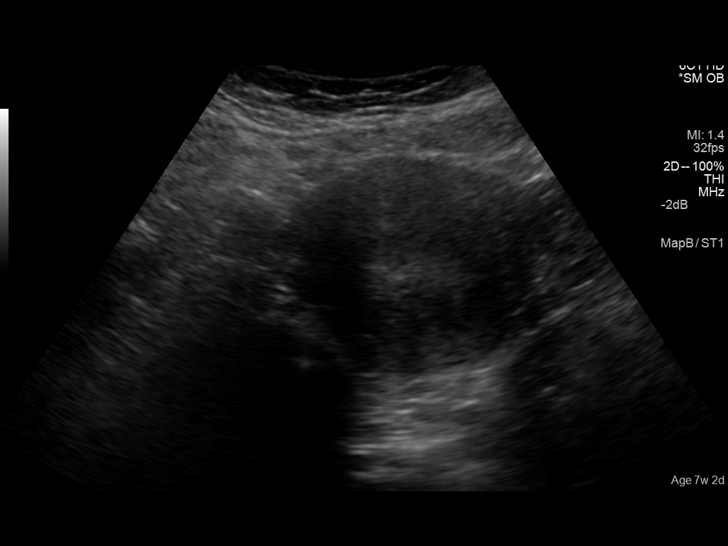
[im 70/208]
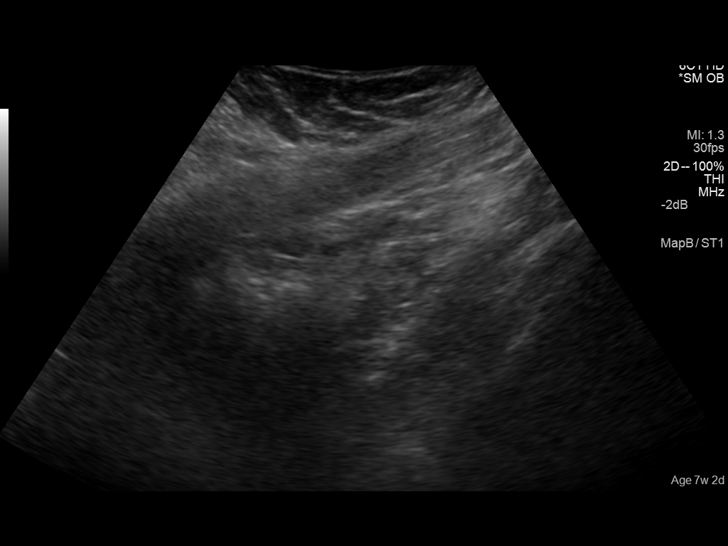
[im 85/208]
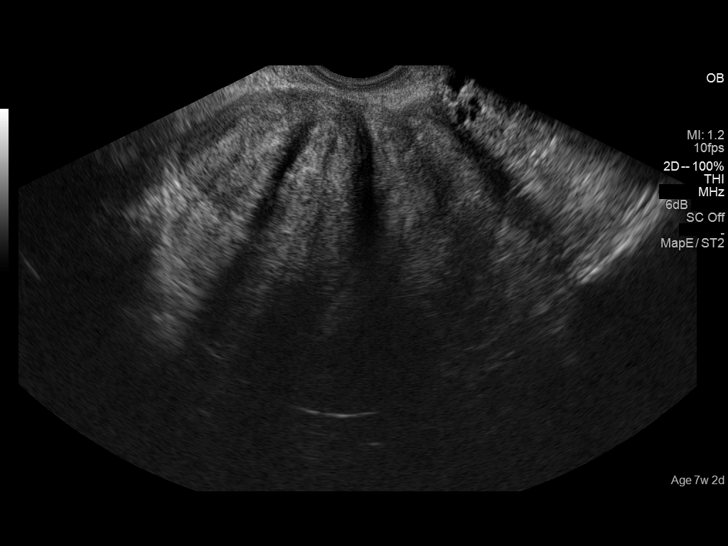
[im 100/208]
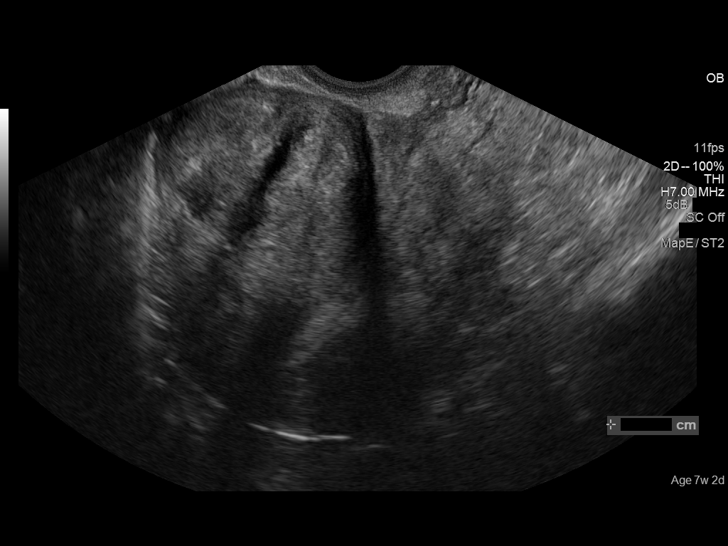
[im 116/208]
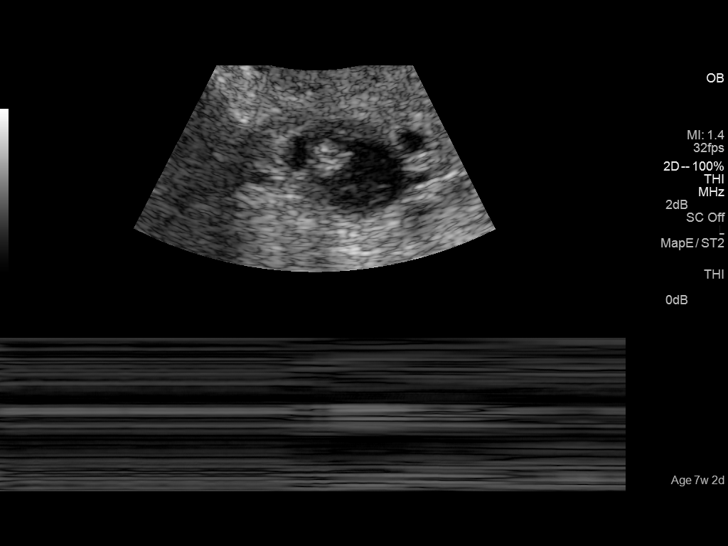
[im 131/208]
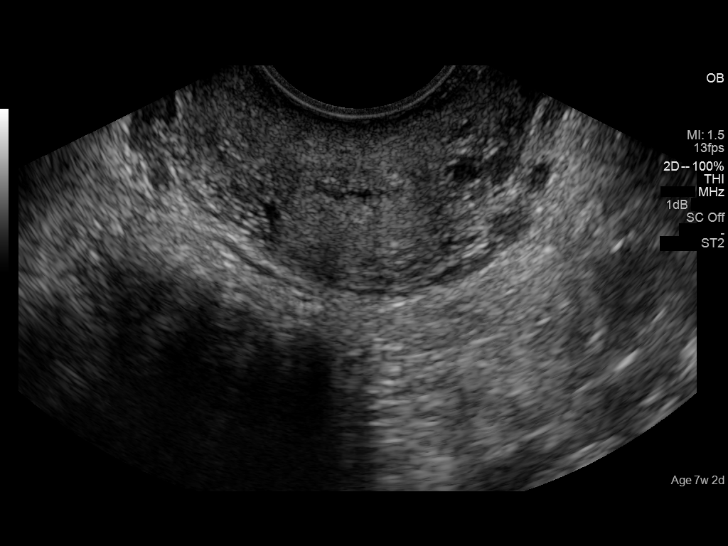
[im 146/208]
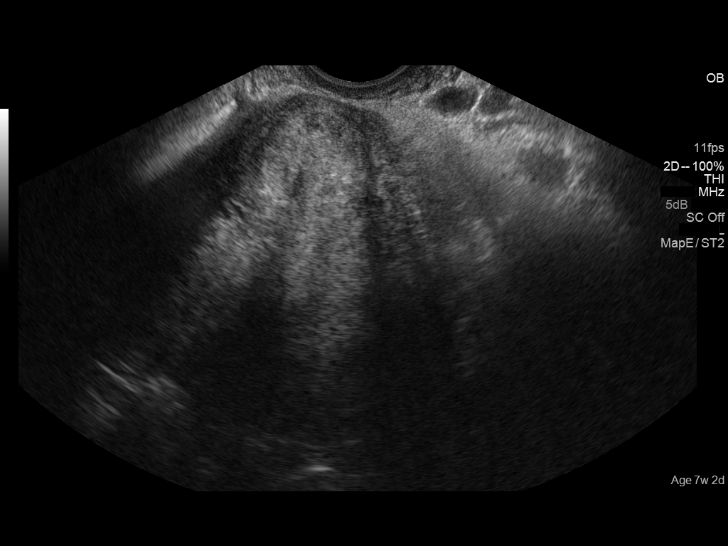
[im 162/208]
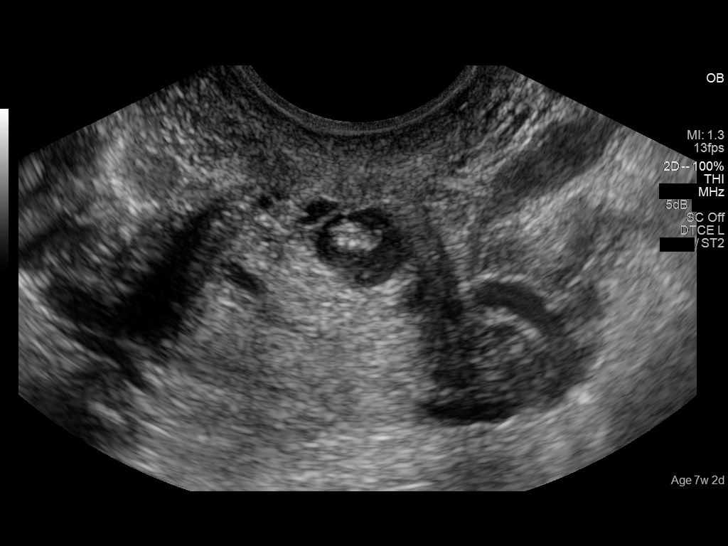
[im 177/208]
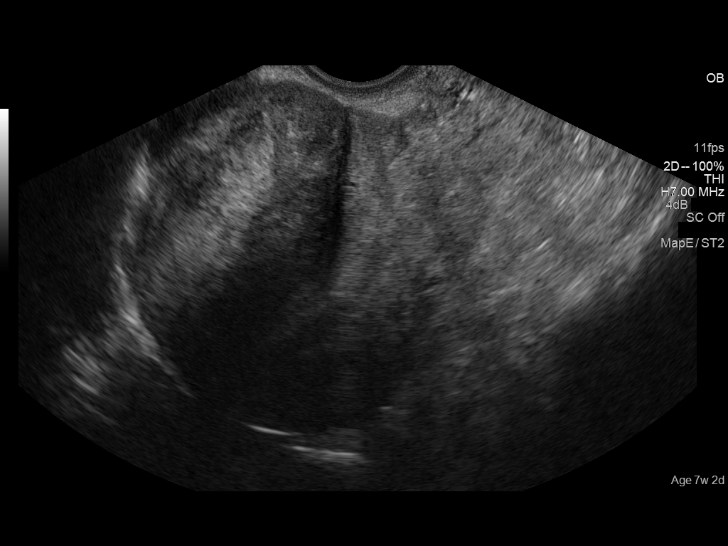
[im 192/208]
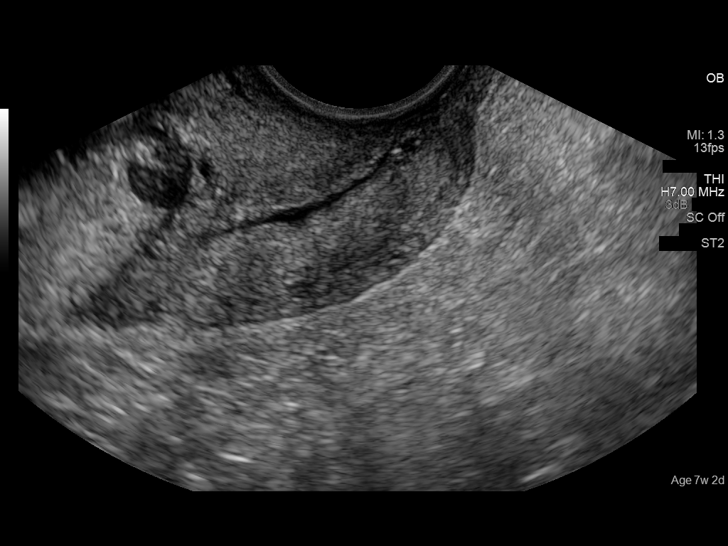
[im 208/208]
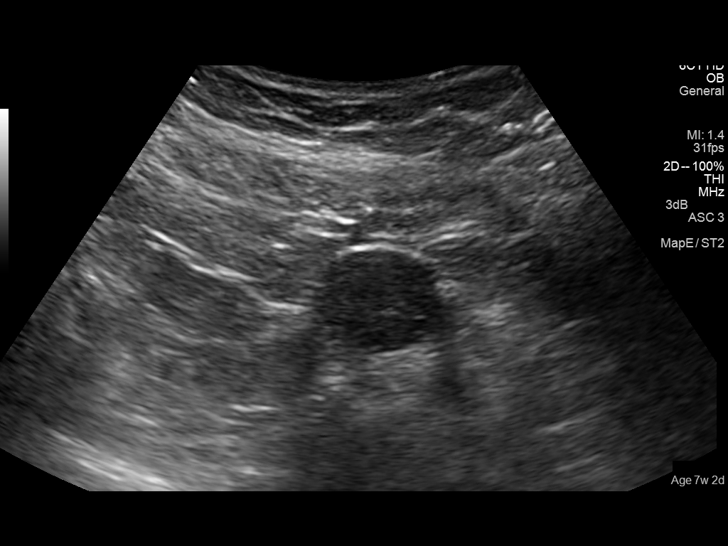

[14 of 28 positions shown; findings below may reference images not displayed]

FINDINGS: Intrauterine gestational sac: There is a single gestational sac,
located in the upper endocervical canal.

Yolk sac:  No

Embryo:  Yes

Cardiac Activity: None

Heart Rate: 0  bpm

MSD:   mm    w     d

CRL:  4.4  mm   6 w   1 d                  US EDC:

Maternal uterus/adnexae: Unremarkable ovaries. 4.5 cm anterior
uterine fibroid.
IMPRESSION: Incomplete abortion. Gestational sac with nonviable fetal pole is
visible in the upper endocervical canal. Incidentally noted 4.5 cm
anterior uterine fibroid.

## 2017-07-07 ENCOUNTER — Other Ambulatory Visit: Payer: Self-pay

## 2017-07-07 ENCOUNTER — Encounter (HOSPITAL_COMMUNITY): Payer: Self-pay

## 2017-07-07 ENCOUNTER — Emergency Department (HOSPITAL_COMMUNITY)
Admission: EM | Admit: 2017-07-07 | Discharge: 2017-07-08 | Disposition: A | Discharge status: Home / Self Care | Payer: Self-pay | Attending: Emergency Medicine | Admitting: Emergency Medicine

## 2017-07-07 ENCOUNTER — Emergency Department (HOSPITAL_COMMUNITY): Payer: Self-pay

## 2017-07-07 DIAGNOSIS — Z79899 Other long term (current) drug therapy: Secondary | ICD-10-CM | POA: Insufficient documentation

## 2017-07-07 DIAGNOSIS — M545 Low back pain: Secondary | ICD-10-CM | POA: Insufficient documentation

## 2017-07-07 DIAGNOSIS — Z87891 Personal history of nicotine dependence: Secondary | ICD-10-CM | POA: Insufficient documentation

## 2017-07-07 LAB — CBC
HCT: 31.8 % — ABNORMAL LOW (ref 36.0–46.0)
Hemoglobin: 9.1 g/dL — ABNORMAL LOW (ref 12.0–15.0)
MCH: 21.7 pg — AB (ref 26.0–34.0)
MCHC: 28.6 g/dL — ABNORMAL LOW (ref 30.0–36.0)
MCV: 75.7 fL — ABNORMAL LOW (ref 78.0–100.0)
PLATELETS: 363 10*3/uL (ref 150–400)
RBC: 4.2 MIL/uL (ref 3.87–5.11)
RDW: 16.2 % — ABNORMAL HIGH (ref 11.5–15.5)
WBC: 7.4 10*3/uL (ref 4.0–10.5)

## 2017-07-07 LAB — URINALYSIS, ROUTINE W REFLEX MICROSCOPIC
Bilirubin Urine: NEGATIVE
Glucose, UA: NEGATIVE mg/dL
KETONES UR: NEGATIVE mg/dL
Leukocytes, UA: NEGATIVE
NITRITE: NEGATIVE
PH: 5 (ref 5.0–8.0)
Protein, ur: NEGATIVE mg/dL
Specific Gravity, Urine: 1.023 (ref 1.005–1.030)

## 2017-07-07 LAB — COMPREHENSIVE METABOLIC PANEL
ALK PHOS: 74 U/L (ref 38–126)
ALT: 48 U/L (ref 14–54)
ANION GAP: 10 (ref 5–15)
AST: 39 U/L (ref 15–41)
Albumin: 3.6 g/dL (ref 3.5–5.0)
BILIRUBIN TOTAL: 0.7 mg/dL (ref 0.3–1.2)
BUN: 9 mg/dL (ref 6–20)
CALCIUM: 8.6 mg/dL — AB (ref 8.9–10.3)
CO2: 22 mmol/L (ref 22–32)
CREATININE: 0.74 mg/dL (ref 0.44–1.00)
Chloride: 105 mmol/L (ref 101–111)
Glucose, Bld: 134 mg/dL — ABNORMAL HIGH (ref 65–99)
Potassium: 3.7 mmol/L (ref 3.5–5.1)
Sodium: 137 mmol/L (ref 135–145)
TOTAL PROTEIN: 7.3 g/dL (ref 6.5–8.1)

## 2017-07-07 LAB — I-STAT BETA HCG BLOOD, ED (MC, WL, AP ONLY): I-stat hCG, quantitative: <5 m[IU]/mL (ref <5)

## 2017-07-07 LAB — LIPASE, BLOOD: Lipase: 32 U/L (ref 11–51)

## 2017-07-07 MED ORDER — LIDOCAINE 5 % EX PTCH
1.0000 | MEDICATED_PATCH | CUTANEOUS | Status: DC
Start: 2017-07-07 — End: 2017-07-08
  Administered 2017-07-07: 1 via TRANSDERMAL
  Filled 2017-07-07: qty 1

## 2017-07-07 MED ORDER — LIDOCAINE 5 % EX PTCH: 1.0000 | MEDICATED_PATCH | CUTANEOUS | 0 refills | Status: AC

## 2017-07-07 MED ORDER — METHOCARBAMOL 500 MG PO TABS: 500.0000 mg | ORAL_TABLET | Freq: Two times a day (BID) | ORAL | 0 refills | Status: AC

## 2017-07-07 MED ORDER — KETOROLAC TROMETHAMINE 30 MG/ML IJ SOLN
30.0000 mg | Freq: Once | INTRAMUSCULAR | Status: AC
  Administered 2017-07-07: 30 mg via INTRAVENOUS
  Filled 2017-07-07: qty 1

## 2017-07-07 MED ORDER — METHOCARBAMOL 1000 MG/10ML IJ SOLN
500.0000 mg | Freq: Once | INTRAVENOUS | Status: AC
  Administered 2017-07-07: 500 mg via INTRAVENOUS
  Filled 2017-07-07: qty 5

## 2017-07-07 MED ORDER — IBUPROFEN 600 MG PO TABS: 600.0000 mg | ORAL_TABLET | Freq: Four times a day (QID) | ORAL | 0 refills | Status: DC | PRN

## 2017-07-07 NOTE — Discharge Instructions (Signed)
We recommend ibuprofen and Robaxin as prescribed for symptoms.  You may use a Lidoderm patch as needed for persistent discomfort.  Follow-up with your doctor regarding your visit to the emergency department today.  You may return for any new or concerning symptoms.

## 2017-07-07 NOTE — ED Notes (Signed)
Ice chips left at bedside for patient.  Patient transported to CT

## 2017-07-07 NOTE — ED Provider Notes (Signed)
[facility] EMERGENCY DEPARTMENT Provider Note   CSN: [id number] Arrival date & time: 07/07/17  1451     History   Chief Complaint Chief Complaint  Patient presents with  . Flank Pain    HPI Alice Austin is a 32 y.o. female.  32 year old female with a history of dyslipidemia, depression, incomplete abortion presents to the emergency department for evaluation of flank pain.  She notes onset of flank pain yesterday more acutely.  This has radiated to her anterior abdomen.  She states that pain is aggravated with movement or walking.  She took Percocet for symptoms yesterday without relief.  No medications taken prior to arrival for pain.  She is currently on her menstrual cycle, but denies any known hematuria.  No dysuria, incontinence, vaginal complaints, nausea, vomiting.  Patient further denies fever.  She is unclear as to whether she has a history of a kidney stone.  Abdominal surgical history significant for cesarean section.  Patient denies hx of IVDU.      Past Medical History:  Diagnosis Date  . Depression   . Hyperlipemia   . Incomplete abortion   . Stroke [facility])    mini strokes age 54 and 48    Patient Active Problem List   Diagnosis Date Noted  . Acute alcoholic intoxication (HCC) 11/27/2016  . Hyperglycemia 11/27/2016  . Normocytic anemia 11/27/2016  . 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 Tendinitis of wrist 10/04/2013  . Bipolar affective disorder (HCC) 06/25/2013  . 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 H/O previous obstetrical problem 06/06/2013  . High-risk pregnancy 06/06/2013  . Adult BMI 30+ 06/06/2013    Past Surgical History:  Procedure Laterality Date  . CESAREAN SECTION     2015  . DILATION AND CURETTAGE OF UTERUS     2009  . DILATION AND EVACUATION N/A 04/25/2015   Procedure: DILATATION AND EVACUATION;  Surgeon: Herold Harms, MD;  Location: [facility] ORS;  Service: Gynecology;  Laterality: N/A;    OB History    Gravida  Para Term Preterm AB Living   4 1 1   2 2    SAB TAB Ectopic Multiple Live Births   1 1     2        Home Medications    Prior to Admission medications   Medication Sig Start Date End Date Taking? Authorizing Provider  metFORMIN (GLUCOPHAGE) 500 MG tablet Take 1 tablet (500 mg total) by mouth daily. 11/28/16 07/07/17 Yes Thomasene Lot, MD  omeprazole (PRILOSEC) 20 MG capsule Take 20 mg by mouth daily.   Yes [provider]  ibuprofen (ADVIL,MOTRIN) 600 MG tablet Take 1 tablet (600 mg total) by mouth every 6 (six) hours as needed. 07/07/17   Antony Madura, PA-C  lidocaine (LIDODERM) 5 % Place 1 patch onto the skin daily. Remove & Discard patch within 12 hours or as directed by MD 07/07/17   Antony Madura, PA-C  methocarbamol (ROBAXIN) 500 MG tablet Take 1 tablet (500 mg total) by mouth 2 (two) times daily. 07/07/17   Antony Madura, PA-C    Family History Family History  Problem Relation Age of Onset  . Diabetes Father   . Cancer Neg Hx   . Heart disease Neg Hx     Social History Social History   Tobacco Use  . Smoking status: Former Games developer  . Smokeless tobacco: Never Used  Substance Use Topics  . Alcohol use: Yes    Comment: occasional  . Drug use: No  Allergies   Patient has no known allergies.   Review of Systems Review of Systems Ten systems reviewed and are negative for acute change, except as noted in the HPI.    Physical Exam Updated Vital Signs BP (!) 116/59   Pulse 75   Temp 98.9 F (37.2 C) (Oral)   Resp 16   LMP 06/30/2017 (Within Days)   SpO2 100%   Physical Exam  Constitutional: She is oriented to person, place, and time. She appears well-developed and well-nourished. No distress.  Nontoxic appearing and in NAD  HENT:  Head: Normocephalic and atraumatic.  Eyes: Conjunctivae and EOM are normal. No scleral icterus.  Neck: Normal range of motion.  Cardiovascular: Normal rate, regular rhythm and intact distal pulses.  Pulmonary/Chest: Effort  normal. No stridor. No respiratory distress.  Respirations even and unlabored  Abdominal: Soft. She exhibits no mass. There is tenderness. There is no guarding.  TTP in the right flank as well as the right mid abdomen. Abdomen soft, obese. No peritoneal signs or palpable masses.  Musculoskeletal: Normal range of motion.  Neurological: She is alert and oriented to person, place, and time. She exhibits normal muscle tone. Coordination normal.  GCS 15. Patient moving all extremities.  Skin: Skin is warm and dry. No rash noted. She is not diaphoretic. No erythema. No pallor.  Psychiatric: She has a normal mood and affect. Her behavior is normal.  Nursing note and vitals reviewed.    ED Treatments / Results  Labs (all labs ordered are listed, but only abnormal results are displayed) Labs Reviewed  COMPREHENSIVE METABOLIC PANEL - Abnormal; Notable for the following components:      Result Value   Glucose, Bld 134 (*)    Calcium 8.6 (*)    All other components within normal limits  CBC - Abnormal; Notable for the following components:   Hemoglobin 9.1 (*)    HCT 31.8 (*)    MCV 75.7 (*)    MCH 21.7 (*)    MCHC 28.6 (*)    RDW 16.2 (*)    All other components within normal limits  URINALYSIS, ROUTINE W REFLEX MICROSCOPIC - Abnormal; Notable for the following components:   Hgb urine dipstick LARGE (*)    Bacteria, UA RARE (*)    Squamous Epithelial / LPF 6-30 (*)    All other components within normal limits  LIPASE, BLOOD  I-STAT BETA HCG BLOOD, ED ([facility], [facility], [facility] ONLY)    EKG  EKG Interpretation None       Radiology Ct Renal Stone Study  Result Date: 07/07/2017 CLINICAL DATA:  32 y/o F; right-sided flank pain radiating into the abdomen. EXAM: CT ABDOMEN AND PELVIS WITHOUT CONTRAST TECHNIQUE: Multidetector CT imaging of the abdomen and pelvis was performed following the standard protocol without IV contrast. COMPARISON:  06/22/2015 CT abdomen and pelvis. FINDINGS: Lower chest:  Moderate hiatal hernia, increased in size from prior study. Hepatobiliary: No focal liver abnormality is seen. No gallstones, gallbladder wall thickening, or biliary dilatation. Enlarged right lobe of liver may represent hepatomegaly or Riedel's lobe. Pancreas: Unremarkable. No pancreatic ductal dilatation or surrounding inflammatory changes. Spleen: Normal in size without focal abnormality. Adrenals/Urinary Tract: Adrenal glands are unremarkable. Kidneys are normal, without renal calculi, focal lesion, or hydronephrosis. Bladder is unremarkable. Stomach/Bowel: Stomach is within normal limits. Appendix appears normal. No evidence of bowel wall thickening, distention, or inflammatory changes. Vascular/Lymphatic: No significant vascular findings are present. No enlarged abdominal or pelvic lymph nodes. Reproductive: Anterior and posterior  fundal myometrial myomas are increased in size from prior CT measuring up to approximately 6 cm. Unremarkable adnexa. Other: Stable small paraumbilical hernia containing fat. Musculoskeletal: No acute or significant osseous findings. IMPRESSION: 1. No acute process identified. 2. Moderate hiatal hernia, increased in size from prior study. 3. Increased size of fundal myometrial myomas measuring up to approximately 6 cm. 4. Enlarged right lobe of liver, possibly hepatomegaly or Riedel's lobe, stable. Electronically Signed   By: Mitzi Hansen M.D.   On: 07/07/2017 22:20    Procedures Procedures (including critical care time)  Medications Ordered in ED Medications  lidocaine (LIDODERM) 5 % 1 patch (1 patch Transdermal Patch Applied 07/07/17 2259)  ketorolac (TORADOL) 30 MG/ML injection 30 mg (30 mg Intravenous Given 07/07/17 2136)  methocarbamol (ROBAXIN) 500 mg in dextrose 5 % 50 mL IVPB (500 mg Intravenous New Bag/Given 07/07/17 2303)     Initial Impression / Assessment and Plan / ED Course  I have reviewed the triage vital signs and the nursing  notes.  Pertinent labs & imaging results that were available during my care of the patient were reviewed by me and considered in my medical decision making (see chart for details).     Patient with back pain.  Reports possible hx of kidney stone, but CT without evidence of urterolithiasis today.  Patient neurovascularly intact.  Pain is aggravated with ambulation.  No reported incontinence.  No hx of fever, cancer, or IVDU.  Labs reassuring.  Symptoms have improved with Toradol, Lidoderm patch, and Robaxin.  Plan for outpatient management with same.  Return precautions discussed and provided. Patient discharged in stable condition with no unaddressed concerns.   Final Clinical Impressions(s) / ED Diagnoses   Final diagnoses:  Acute right-sided low back pain, with sciatica presence unspecified    ED Discharge Orders        Ordered    methocarbamol (ROBAXIN) 500 MG tablet  2 times daily     07/07/17 2339    ibuprofen (ADVIL,MOTRIN) 600 MG tablet  Every 6 hours PRN     07/07/17 2339    lidocaine (LIDODERM) 5 %  Every 24 hours     07/07/17 2339       Antony Madura, PA-C 07/07/17 2344    Bethann Berkshire, MD 07/07/17 2344

## 2017-07-07 NOTE — ED Triage Notes (Signed)
Pt here with right side flank pain radiating into her abdomen. Pt states pain began yesterday. She reports she has trouble walking due to the pain.

## 2018-08-04 ENCOUNTER — Emergency Department (HOSPITAL_COMMUNITY)
Admission: EM | Admit: 2018-08-04 | Discharge: 2018-08-05 | Disposition: A | Discharge status: Home / Self Care | Payer: Self-pay | Attending: Emergency Medicine | Admitting: Emergency Medicine

## 2018-08-04 ENCOUNTER — Emergency Department (HOSPITAL_COMMUNITY): Payer: Self-pay

## 2018-08-04 ENCOUNTER — Encounter (HOSPITAL_COMMUNITY): Payer: Self-pay

## 2018-08-04 DIAGNOSIS — J101 Influenza due to other identified influenza virus with other respiratory manifestations: Secondary | ICD-10-CM | POA: Insufficient documentation

## 2018-08-04 DIAGNOSIS — Z87891 Personal history of nicotine dependence: Secondary | ICD-10-CM | POA: Insufficient documentation

## 2018-08-04 DIAGNOSIS — J111 Influenza due to unidentified influenza virus with other respiratory manifestations: Secondary | ICD-10-CM

## 2018-08-04 DIAGNOSIS — R059 Cough, unspecified: Secondary | ICD-10-CM

## 2018-08-04 DIAGNOSIS — Z7984 Long term (current) use of oral hypoglycemic drugs: Secondary | ICD-10-CM | POA: Insufficient documentation

## 2018-08-04 DIAGNOSIS — R05 Cough: Secondary | ICD-10-CM

## 2018-08-04 MED ORDER — SODIUM CHLORIDE 0.9 % IV BOLUS
1000.0000 mL | Freq: Once | INTRAVENOUS | Status: AC
  Administered 2018-08-05: 1000 mL via INTRAVENOUS

## 2018-08-04 MED ORDER — ALBUTEROL SULFATE (2.5 MG/3ML) 0.083% IN NEBU
5.0000 mg | INHALATION_SOLUTION | Freq: Once | RESPIRATORY_TRACT | Status: AC
  Administered 2018-08-04: 5 mg via RESPIRATORY_TRACT
  Filled 2018-08-04: qty 6

## 2018-08-04 MED ORDER — ACETAMINOPHEN 500 MG PO TABS
1000.0000 mg | ORAL_TABLET | Freq: Once | ORAL | Status: AC
  Administered 2018-08-04: 1000 mg via ORAL
  Filled 2018-08-04: qty 2

## 2018-08-04 NOTE — ED Triage Notes (Signed)
Pt states that for the past three days having cough, fever, congestion, headache, sore throat, unrelieved by OTC meds

## 2018-08-04 NOTE — ED Provider Notes (Signed)
[facility] EMERGENCY DEPARTMENT Provider Note   CSN: [id number] Arrival date & time: 08/04/18  1926     History   Chief Complaint Chief Complaint  Patient presents with  . Influenza    HPI Alice Austin is a 33 y.o. female with a hx of CVA, hyperlipidemia, anemia, depression presents to the Emergency Department complaining of gradual, persistent, progressively worsening your eye symptoms onset 3 days ago.  Patient reports fevers, chills, rhinorrhea, postnasal drip, severe sore throat, cough, shortness of breath.  Patient denies a history of asthma but feels as if she is wheezing.  She reports pain in her chest when she takes a deep breath.  Also patient complains of feeling of throat swelling.  She reports mild headaches but no neck pain or neck stiffness.  She did not receive a flu vaccine this year.  No known sick contacts.  Patient reports she is taking TheraFlu without relief.  No specific aggravating or alleviating factors.  She does not smoke.  The history is provided by the patient and medical records. No language interpreter was used.    Past Medical History:  Diagnosis Date  . Depression   . Hyperlipemia   . Incomplete abortion   . Stroke [facility])    mini strokes age 45 and 66    Patient Active Problem List   Diagnosis Date Noted  . Acute alcoholic intoxication (HCC) 11/27/2016  . Hyperglycemia 11/27/2016  . Normocytic anemia 11/27/2016  . 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 Tendinitis of wrist 10/04/2013  . Bipolar affective disorder (HCC) 06/25/2013  . 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 H/O previous obstetrical problem 06/06/2013  . High-risk pregnancy 06/06/2013  . Adult BMI 30+ 06/06/2013    Past Surgical History:  Procedure Laterality Date  . CESAREAN SECTION     2015  . DILATION AND CURETTAGE OF UTERUS     2009  . DILATION AND EVACUATION N/A 04/25/2015   Procedure: DILATATION AND EVACUATION;  Surgeon: Herold Harms, MD;   Location: [facility] ORS;  Service: Gynecology;  Laterality: N/A;     OB History    Gravida  4   Para  1   Term  1   Preterm      AB  2   Living  2     SAB  1   TAB  1   Ectopic      Multiple      Live Births  2            Home Medications    Prior to Admission medications   Medication Sig Start Date End Date Taking? Authorizing Provider  benzonatate (TESSALON PERLES) 100 MG capsule Take 1 capsule (100 mg total) by mouth 3 (three) times daily as needed for cough (cough). 08/05/18   [place] Bryngelson, Dahlia Client, PA-C  fluticasone (FLONASE) 50 MCG/ACT nasal spray Place 2 sprays into both nostrils daily. 08/05/18   Anayiah Howden, Dahlia Client, PA-C  ibuprofen (ADVIL,MOTRIN) 600 MG tablet Take 1 tablet (600 mg total) by mouth every 6 (six) hours as needed. 07/07/17   Antony Madura, PA-C  lidocaine (LIDODERM) 5 % Place 1 patch onto the skin daily. Remove & Discard patch within 12 hours or as directed by MD 07/07/17   Antony Madura, PA-C  metFORMIN (GLUCOPHAGE) 500 MG tablet Take 1 tablet (500 mg total) by mouth daily. 11/28/16 07/07/17  Thomasene Lot, MD  methocarbamol (ROBAXIN) 500 MG tablet Take 1 tablet (500 mg total) by mouth 2 (two) times daily.  07/07/17   Antony MaduraHumes, Kelly, PA-C  omeprazole (PRILOSEC) 20 MG capsule Take 20 mg by mouth daily.    [provider]    Family History Family History  Problem Relation Age of Onset  . Diabetes Father   . Cancer Neg Hx   . Heart disease Neg Hx     Social History Social History   Tobacco Use  . Smoking status: Former Games developermoker  . Smokeless tobacco: Never Used  Substance Use Topics  . Alcohol use: Yes    Comment: occasional  . Drug use: No     Allergies   Patient has no known allergies.   Review of Systems Review of Systems  Constitutional: Positive for fatigue and fever. Negative for appetite change and chills.  HENT: Positive for congestion, postnasal drip, rhinorrhea, sinus pressure, sore throat and trouble swallowing. Negative  for ear discharge, ear pain and mouth sores.   Eyes: Negative for visual disturbance.  Respiratory: Positive for cough, chest tightness, shortness of breath and wheezing. Negative for stridor.   Cardiovascular: Negative for chest pain, palpitations and leg swelling.  Gastrointestinal: Negative for abdominal pain, diarrhea, nausea and vomiting.  Genitourinary: Negative for dysuria, frequency, hematuria and urgency.  Musculoskeletal: Negative for arthralgias, back pain, myalgias and neck stiffness.  Skin: Negative for rash.  Neurological: Positive for headaches. Negative for syncope, light-headedness and numbness.  Hematological: Negative for adenopathy.  Psychiatric/Behavioral: The patient is not nervous/anxious.   All other systems reviewed and are negative.    Physical Exam Updated Vital Signs BP 110/63 (BP Location: Right Arm)   Pulse (!) 107   Temp 99.7 F (37.6 C) (Oral)   Resp 16   SpO2 98%   Physical Exam Vitals signs and nursing note reviewed.  Constitutional:      General: She is not in acute distress.    Appearance: She is well-developed. She is not diaphoretic.  HENT:     Head: Normocephalic and atraumatic.     Right Ear: Tympanic membrane, ear canal and external ear normal.     Left Ear: Tympanic membrane, ear canal and external ear normal.     Nose: Mucosal edema, congestion and rhinorrhea present.     Right Sinus: No maxillary sinus tenderness or frontal sinus tenderness.     Left Sinus: No maxillary sinus tenderness or frontal sinus tenderness.     Mouth/Throat:     Mouth: Mucous membranes are not pale and not cyanotic.     Pharynx: Uvula midline. Posterior oropharyngeal erythema present. No oropharyngeal exudate or uvula swelling.     Tonsils: No tonsillar abscesses. Swelling: 4+ on the right. 4+ on the left.     Comments: Kissing tonsils with erythema and petechiae of the soft palate; no exudate. Eyes:     Conjunctiva/sclera: Conjunctivae normal.     Pupils:  Pupils are equal, round, and reactive to light.  Neck:     Musculoskeletal: Full passive range of motion without pain and normal range of motion. No neck rigidity.  Cardiovascular:     Rate and Rhythm: Regular rhythm. Tachycardia present.  Pulmonary:     Effort: Tachypnea and accessory muscle usage present.     Breath sounds: No stridor. Decreased breath sounds and wheezing present.     Comments: Diminished breath sounds throughout with expiratory wheezes.  Congested cough. Abdominal:     Palpations: Abdomen is soft.     Tenderness: There is no abdominal tenderness.  Musculoskeletal: Normal range of motion.  Skin:  General: Skin is warm and dry.     Findings: No rash.     Comments: Skin is hot to touch.  Neurological:     Mental Status: She is alert.      ED Treatments / Results  Labs (all labs ordered are listed, but only abnormal results are displayed) Labs Reviewed  CBC WITH DIFFERENTIAL/PLATELET - Abnormal; Notable for the following components:      Result Value   Hemoglobin 9.0 (*)    HCT 31.9 (*)    MCV 73.5 (*)    MCH 20.7 (*)    MCHC 28.2 (*)    RDW 17.9 (*)    nRBC 0.3 (*)    All other components within normal limits  COMPREHENSIVE METABOLIC PANEL - Abnormal; Notable for the following components:   Potassium 3.3 (*)    Glucose, Bld 141 (*)    Calcium 8.4 (*)    ALT 49 (*)    All other components within normal limits  INFLUENZA PANEL BY PCR (TYPE A & B) - Abnormal; Notable for the following components:   Influenza B By PCR POSITIVE (*)    All other components within normal limits  GROUP A STREP BY PCR    Radiology Dg Chest 2 View  Result Date: 08/04/2018 CLINICAL DATA:  Initial evaluation for acute shortness of breath, fever, cough. EXAM: CHEST - 2 VIEW COMPARISON:  Prior radiograph from 11/27/2016. FINDINGS: Transverse heart size at the upper limits of normal. Mediastinal silhouette within normal limits. Lungs mildly hypoinflated. Mild scattered vascular  congestion with peribronchial thickening, which could reflect mild interstitial congestion and/or bronchiolitis/atypical infection. No consolidative opacity to suggest pneumonia. No frank pulmonary edema or pleural effusion. No pneumothorax. No acute osseous abnormality. IMPRESSION: Mild diffuse vascular congestion with peribronchial thickening, which could reflect mild interstitial congestion and/or bronchiolitis/atypical infection. No consolidative opacity to suggest pneumonia. Electronically Signed   By: Rise Mu M.D.   On: 08/04/2018 23:39    Procedures Procedures (including critical care time)  Angiocath insertion Performed by: Dahlia Client Gomer France  Consent: Verbal consent obtained. Risks and benefits: risks, benefits and alternatives were discussed Time out: Immediately prior to procedure a "time out" was called to verify the correct patient, procedure, equipment, support staff and site/side marked as required.  Preparation: Patient was prepped and draped in the usual sterile fashion.  Vein Location: R brachiocephalic  Not Ultrasound Guided  Gauge: 18ga  Normal blood return and flush without difficulty Patient tolerance: Patient tolerated the procedure well with no immediate complications.     Medications Ordered in ED Medications  albuterol (PROVENTIL HFA;VENTOLIN HFA) 108 (90 Base) MCG/ACT inhaler 2 puff (2 puffs Inhalation Provided for home use 08/05/18 0300)  sodium chloride 0.9 % bolus 1,000 mL (0 mLs Intravenous Stopped 08/05/18 0149)  albuterol (PROVENTIL) (2.5 MG/3ML) 0.083% nebulizer solution 5 mg (5 mg Nebulization Given 08/04/18 2348)  acetaminophen (TYLENOL) tablet 1,000 mg (1,000 mg Oral Given 08/04/18 2348)  potassium chloride SA (K-DUR,KLOR-CON) CR tablet 40 mEq (40 mEq Oral Given 08/05/18 0259)  AEROCHAMBER PLUS FLO-VU LARGE MISC 1 each (1 each Other Provided for home use 08/05/18 0259)     Initial Impression / Assessment and Plan / ED Course  I have  reviewed the triage vital signs and the nursing notes.  Pertinent labs & imaging results that were available during my care of the patient were reviewed by me and considered in my medical decision making (see chart for details).  Clinical Course as of Aug 05 1608  Sat Aug 05, 2018  0055 Pt continues to cough, but now has clear and equal breath sounds   [HM]  0055 Baseline  Hemoglobin(!): 9.0 [HM]  0102 Noted.  Oral potassium given  Potassium(!): 3.3 [HM]  0102 noted  Influenza B By PCR(!): POSITIVE [HM]    Clinical Course User Index [HM] Ashlan Dignan, Dahlia Client, PA-C    Patient with symptoms consistent with influenza.  Reported fevers at home.  Tachycardic on arrival and upon my initial assessment.  Tolerating PO's; food bolus given here.  Lungs with wheezing and rhonchi.  Chest x-ray without evidence of pneumonia.  Patient is generally weak.  Patient given fluids, albuterol and fever control.  Labs show mild hypokalemia, given potassium here in the emergency department.  Anemia with a hemoglobin of 9.0 appears to be baseline.  Influenza is positive.  Repeat lung exam after albuterol is without wheezing or focal breath sounds.  Patient reports feeling significantly better after fluids and tachycardia has resolved.  She is ambulatory here in the emergency department without difficulty.  The patient understands that symptoms are greater than the recommended 24-48 hour window of treatment.  Patient will be discharged with instructions to orally hydrate, rest, and use over-the-counter medications such as anti-inflammatories ibuprofen and Aleve for muscle aches and Tylenol for fever.  Patient will also be given a cough suppressant.  Discussed reasons to return immediately to the emergency department.  Patient states understanding and is in agreement with the plan.  BP 107/63 (BP Location: Right Arm)   Pulse 92   Temp 99.7 F (37.6 C) (Oral)   Resp 18   SpO2 97%    Final Clinical Impressions(s)  / ED Diagnoses   Final diagnoses:  Influenza  Cough    ED Discharge Orders         Ordered    benzonatate (TESSALON PERLES) 100 MG capsule  3 times daily PRN     08/05/18 0135    fluticasone (FLONASE) 50 MCG/ACT nasal spray  Daily     08/05/18 0135           Sanav Remer, Dahlia Client, PA-C 08/05/18 0416    Gilda Crease, MD 08/06/18 [phone]

## 2018-08-05 LAB — CBC WITH DIFFERENTIAL/PLATELET
Abs Immature Granulocytes: 0.06 10*3/uL (ref 0.00–0.07)
Basophils Absolute: 0 10*3/uL (ref 0.0–0.1)
Basophils Relative: 0 %
Eosinophils Absolute: 0.1 10*3/uL (ref 0.0–0.5)
Eosinophils Relative: 1 %
HCT: 31.9 % — ABNORMAL LOW (ref 36.0–46.0)
Hemoglobin: 9 g/dL — ABNORMAL LOW (ref 12.0–15.0)
Immature Granulocytes: 1 %
LYMPHS PCT: 37 %
Lymphs Abs: 2.7 10*3/uL (ref 0.7–4.0)
MCH: 20.7 pg — ABNORMAL LOW (ref 26.0–34.0)
MCHC: 28.2 g/dL — ABNORMAL LOW (ref 30.0–36.0)
MCV: 73.5 fL — ABNORMAL LOW (ref 80.0–100.0)
Monocytes Absolute: 1 10*3/uL (ref 0.1–1.0)
Monocytes Relative: 13 %
NEUTROS ABS: 3.5 10*3/uL (ref 1.7–7.7)
Neutrophils Relative %: 48 %
Platelets: 281 10*3/uL (ref 150–400)
RBC: 4.34 MIL/uL (ref 3.87–5.11)
RDW: 17.9 % — ABNORMAL HIGH (ref 11.5–15.5)
WBC: 7.4 10*3/uL (ref 4.0–10.5)
nRBC: 0.3 % — ABNORMAL HIGH (ref 0.0–0.2)

## 2018-08-05 LAB — COMPREHENSIVE METABOLIC PANEL
ALT: 49 U/L — ABNORMAL HIGH (ref 0–44)
ANION GAP: 9 (ref 5–15)
AST: 38 U/L (ref 15–41)
Albumin: 3.6 g/dL (ref 3.5–5.0)
Alkaline Phosphatase: 55 U/L (ref 38–126)
BUN: 11 mg/dL (ref 6–20)
CALCIUM: 8.4 mg/dL — AB (ref 8.9–10.3)
CO2: 24 mmol/L (ref 22–32)
Chloride: 103 mmol/L (ref 98–111)
Creatinine, Ser: 0.74 mg/dL (ref 0.44–1.00)
GFR calc non Af Amer: >60 mL/min (ref >60)
Glucose, Bld: 141 mg/dL — ABNORMAL HIGH (ref 70–99)
Potassium: 3.3 mmol/L — ABNORMAL LOW (ref 3.5–5.1)
Sodium: 136 mmol/L (ref 135–145)
Total Bilirubin: 0.6 mg/dL (ref 0.3–1.2)
Total Protein: 7.5 g/dL (ref 6.5–8.1)

## 2018-08-05 LAB — INFLUENZA PANEL BY PCR (TYPE A & B)
INFLAPCR: NEGATIVE
INFLBPCR: POSITIVE — AB

## 2018-08-05 LAB — GROUP A STREP BY PCR: Group A Strep by PCR: NOT DETECTED

## 2018-08-05 MED ORDER — AEROCHAMBER PLUS FLO-VU LARGE MISC
1.0000 | Freq: Once | Status: AC
  Administered 2018-08-05: 1

## 2018-08-05 MED ORDER — BENZONATATE 100 MG PO CAPS: 100.0000 mg | ORAL_CAPSULE | Freq: Three times a day (TID) | ORAL | 0 refills | Status: AC | PRN

## 2018-08-05 MED ORDER — FLUTICASONE PROPIONATE 50 MCG/ACT NA SUSP: 2.0000 | Freq: Every day | NASAL | 2 refills | Status: AC

## 2018-08-05 MED ORDER — POTASSIUM CHLORIDE CRYS ER 20 MEQ PO TBCR
40.0000 meq | EXTENDED_RELEASE_TABLET | Freq: Once | ORAL | Status: AC
  Administered 2018-08-05: 40 meq via ORAL
  Filled 2018-08-05: qty 2

## 2018-08-05 MED ORDER — ALBUTEROL SULFATE HFA 108 (90 BASE) MCG/ACT IN AERS
2.0000 | INHALATION_SPRAY | RESPIRATORY_TRACT | Status: DC | PRN
  Administered 2018-08-05: 2 via RESPIRATORY_TRACT
  Filled 2018-08-05: qty 6.7

## 2018-08-05 NOTE — Discharge Instructions (Signed)
1. Medications: flonase, albuterol, tessalon, usual home medications 2. Treatment: rest, drink plenty of fluids, take tylenol or ibuprofen for fever control 3. Follow Up: Please followup with your primary doctor in 3 days for discussion of your diagnoses and further evaluation after today's visit; if you do not have a primary care doctor use the resource guide provided to find one; Return to the ER for high fevers, difficulty breathing or other concerning symptoms

## 2020-05-26 ENCOUNTER — Emergency Department (HOSPITAL_COMMUNITY)
Admission: EM | Admit: 2020-05-26 | Discharge: 2020-05-26 | Disposition: A | Discharge status: Home / Self Care | Payer: Self-pay | Attending: Emergency Medicine | Admitting: Emergency Medicine

## 2020-05-26 ENCOUNTER — Encounter (HOSPITAL_COMMUNITY): Payer: Self-pay | Admitting: Emergency Medicine

## 2020-05-26 DIAGNOSIS — Z87891 Personal history of nicotine dependence: Secondary | ICD-10-CM | POA: Insufficient documentation

## 2020-05-26 DIAGNOSIS — E1165 Type 2 diabetes mellitus with hyperglycemia: Secondary | ICD-10-CM | POA: Insufficient documentation

## 2020-05-26 DIAGNOSIS — N751 Abscess of Bartholin's gland: Secondary | ICD-10-CM | POA: Insufficient documentation

## 2020-05-26 DIAGNOSIS — Z7984 Long term (current) use of oral hypoglycemic drugs: Secondary | ICD-10-CM | POA: Insufficient documentation

## 2020-05-26 MED ORDER — IBUPROFEN 600 MG PO TABS: 600.0000 mg | ORAL_TABLET | Freq: Four times a day (QID) | ORAL | 0 refills | Status: AC | PRN

## 2020-05-26 MED ORDER — OXYCODONE-ACETAMINOPHEN 5-325 MG PO TABS
1.0000 | ORAL_TABLET | Freq: Once | ORAL | Status: AC
  Administered 2020-05-26: 1 via ORAL
  Filled 2020-05-26: qty 1

## 2020-05-26 MED ORDER — HYDROCODONE-ACETAMINOPHEN 5-325 MG PO TABS: 1.0000 | ORAL_TABLET | Freq: Four times a day (QID) | ORAL | 0 refills | Status: AC | PRN

## 2020-05-26 MED ORDER — LIDOCAINE-EPINEPHRINE 1 %-1:100000 IJ SOLN
5.0000 mL | Freq: Once | INTRAMUSCULAR | Status: AC
  Administered 2020-05-26: 5 mL
  Filled 2020-05-26: qty 1

## 2020-05-26 NOTE — ED Triage Notes (Signed)
Pt arrives to ED with chief complaint of abscess to groin.  She states she first noticed this 2 days ago and has increase in size and discomfort.

## 2020-05-26 NOTE — ED Provider Notes (Signed)
[facility] EMERGENCY DEPARTMENT Provider Note   CSN: [id number] Arrival date & time: 05/26/20  4098     History Chief Complaint  Patient presents with  . Abscess    Alice Austin is a 34 y.o. female presenting to the ED with complaint of abscess to labia. She states a couple of days ago she noticed what appeared to be a pimple.  She tried to pop it however had no relief.  The swelling and pain has gradually worsened.  She has never had this occur in the past.  No fevers or chills.  She does not have a gynecologist.  No interventions tried prior to arrival.  The history is provided by the patient.       Past Medical History:  Diagnosis Date  . Depression   . Hyperlipemia   . Incomplete abortion   . Stroke [facility])    mini strokes age 60 and 42    Patient Active Problem List   Diagnosis Date Noted  . Acute alcoholic intoxication (HCC) 11/27/2016  . Hyperglycemia 11/27/2016  . Normocytic anemia 11/27/2016  . 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 Tendinitis of wrist 10/04/2013  . Bipolar affective disorder (HCC) 06/25/2013  . 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 H/O previous obstetrical problem 06/06/2013  . High-risk pregnancy 06/06/2013  . Adult BMI 30+ 06/06/2013    Past Surgical History:  Procedure Laterality Date  . CESAREAN SECTION     2015  . DILATION AND CURETTAGE OF UTERUS     2009  . DILATION AND EVACUATION N/A 04/25/2015   Procedure: DILATATION AND EVACUATION;  Surgeon: Herold Harms, MD;  Location: [facility] ORS;  Service: Gynecology;  Laterality: N/A;     OB History    Gravida  4   Para  1   Term  1   Preterm      AB  2   Living  2     SAB  1   TAB  1   Ectopic      Multiple      Live Births  2           Family History  Problem Relation Age of Onset  . Diabetes Father   . Cancer Neg Hx   . Heart disease Neg Hx     Social History   Tobacco Use  . Smoking status: Former Games developer  . Smokeless tobacco: Never  Used  Substance Use Topics  . Alcohol use: Yes    Comment: occasional  . Drug use: No    Home Medications Prior to Admission medications   Medication Sig Start Date End Date Taking? Authorizing Provider  benzonatate (TESSALON PERLES) 100 MG capsule Take 1 capsule (100 mg total) by mouth 3 (three) times daily as needed for cough (cough). 08/05/18   Muthersbaugh, Dahlia Client, PA-C  fluticasone (FLONASE) 50 MCG/ACT nasal spray Place 2 sprays into both nostrils daily. 08/05/18   Muthersbaugh, Dahlia Client, PA-C  HYDROcodone-acetaminophen (NORCO/VICODIN) 5-325 MG tablet Take 1-2 tablets by mouth every 6 (six) hours as needed for severe pain. 05/26/20   Lalitha Ilyas, [place] N, PA-C  ibuprofen (ADVIL) 600 MG tablet Take 1 tablet (600 mg total) by mouth every 6 (six) hours as needed. 05/26/20   Hattie Aguinaldo, [place] N, PA-C  lidocaine (LIDODERM) 5 % Place 1 patch onto the skin daily. Remove & Discard patch within 12 hours or as directed by MD 07/07/17   Antony Madura, PA-C  metFORMIN (GLUCOPHAGE) 500 MG tablet Take 1 tablet (  500 mg total) by mouth daily. 11/28/16 07/07/17  Thomasene Lot, MD  methocarbamol (ROBAXIN) 500 MG tablet Take 1 tablet (500 mg total) by mouth 2 (two) times daily. 07/07/17   Antony Madura, PA-C  omeprazole (PRILOSEC) 20 MG capsule Take 20 mg by mouth daily.    [provider]    Allergies    Patient has no known allergies.  Review of Systems   Review of Systems  All other systems reviewed and are negative.   Physical Exam Updated Vital Signs BP 109/63 (BP Location: Right Arm)   Pulse 80   Temp 98.6 F (37 C) (Oral)   Resp 18   SpO2 96%   Physical Exam Vitals and nursing note reviewed.  Constitutional:      Appearance: She is well-developed.  HENT:     Head: Normocephalic and atraumatic.  Eyes:     Conjunctiva/sclera: Conjunctivae normal.  Cardiovascular:     Rate and Rhythm: Normal rate.  Pulmonary:     Effort: Pulmonary effort is normal.  Abdominal:     Palpations:  Abdomen is soft.  Genitourinary:    Comments: Exam performed with RN Velora Mediate chaperone present.  The right labia majora is erythematous and swollen.  There is a fluctuant 1cm areas of fluctuance overlying a larger 3cm rounded firm mass to the more posterior portion of the labia. Not actively draining. Skin:    General: Skin is warm.  Neurological:     Mental Status: She is alert.  Psychiatric:        Behavior: Behavior normal.     ED Results / Procedures / Treatments   Labs (all labs ordered are listed, but only abnormal results are displayed) Labs Reviewed - No data to display  EKG None  Radiology No results found.  Procedures .Marland KitchenIncision and Drainage  Date/Time: 05/26/2020 2:07 PM Performed by: Jonathyn Carothers, [place] N, PA-C Authorized by: Lelani Garnett, [place] N, PA-C   Consent:    Consent obtained:  Verbal   Consent given by:  Patient   Risks discussed:  Pain, incomplete drainage and bleeding Location:    Type:  Bartholin cyst (bartholin cyst with abscess)   Size:  1cm   Location:  Anogenital   Anogenital location:  Bartholin's gland Pre-procedure details:    Skin preparation:  Betadine Anesthesia (see MAR for exact dosages):    Anesthesia method:  Local infiltration   Local anesthetic:  Lidocaine 1% WITH epi Procedure type:    Complexity:  Simple Procedure details:    Needle aspiration: no     Incision types:  Single straight   Incision depth:  Dermal   Scalpel blade:  11   Wound management:  Probed and deloculated and irrigated with saline   Drainage:  Bloody and purulent   Drainage amount:  Moderate   Wound treatment:  Wound left open   Packing materials:  None Post-procedure details:    Patient tolerance of procedure:  Tolerated well, no immediate complications   (including critical care time)  Medications Ordered in ED Medications  lidocaine-EPINEPHrine (XYLOCAINE W/EPI) 1 %-1:100000 (with pres) injection 5 mL (5 mLs Infiltration Given by Other 05/26/20  1310)  oxyCODONE-acetaminophen (PERCOCET/ROXICET) 5-325 MG per tablet 1 tablet (1 tablet Oral Given 05/26/20 1212)    ED Course  I have reviewed the triage vital signs and the nursing notes.  Pertinent labs & imaging results that were available during my care of the patient were reviewed by me and considered in my medical decision making (see  chart for details).    MDM Rules/Calculators/A&P                          Patient with what appears to be bartholin abscess overlying bartholins cyst, amenable to I & D. No systemic symptoms or hx of immunocompromise, abx not indicated.  Abscess was drained without complication.  She is treated in the ED for pain, and will be prescribed pain medication and gynecology referral.  She is to follow up in 2 to 3 days for recheck, return if worsening.  Patient is agreeable plan, appropriate for discharge.  Discussed results, findings, treatment and follow up. Patient advised of return precautions. Patient verbalized understanding and agreed with plan.  [place] Controlled Substance reporting System queried  Final Clinical Impression(s) / ED Diagnoses Final diagnoses:  Bartholin's gland abscess    Rx / DC Orders ED Discharge Orders         Ordered    HYDROcodone-acetaminophen (NORCO/VICODIN) 5-325 MG tablet  Every 6 hours PRN        05/26/20 1334    ibuprofen (ADVIL) 600 MG tablet  Every 6 hours PRN        05/26/20 1334           Nellene Courtois, [place] N, PA-C 05/26/20 1410    Eber Hong, MD 05/27/20 1041

## 2020-05-26 NOTE — Discharge Instructions (Signed)
Please read instructions below.  Keep your wound clean and covered. Soak/flush your wound with warm water or a sitz bath, at least once per day. You can take hydrocodone as needed for more severe pain. You can take Advil/ibuprofen every 6 hours as needed for pain. Follow up with gynecology or your primary care or urgent care for wound recheck in 2 days.  Return to the ER for fever, worsening redness, or new or worsening symptoms.
